# Patient Record
Sex: Female | Born: 1935 | Race: White | Hispanic: No | Marital: Married | Smoking: Never smoker
Health system: Southern US, Community
[De-identification: ages and names within clinical notes are randomized; demographics above are authoritative.]

## PROBLEM LIST (undated history)

## (undated) DIAGNOSIS — E079 Disorder of thyroid, unspecified: Secondary | ICD-10-CM

## (undated) DIAGNOSIS — M1990 Unspecified osteoarthritis, unspecified site: Secondary | ICD-10-CM

## (undated) DIAGNOSIS — K219 Gastro-esophageal reflux disease without esophagitis: Secondary | ICD-10-CM

## (undated) HISTORY — PX: OTHER SURGICAL HISTORY: SHX169

## (undated) HISTORY — PX: KNEE SURGERY: SHX244

---

## 2012-08-05 ENCOUNTER — Emergency Department (HOSPITAL_BASED_OUTPATIENT_CLINIC_OR_DEPARTMENT_OTHER)
Admission: EM | Admit: 2012-08-05 | Discharge: 2012-08-05 | Disposition: A | Discharge status: Home / Self Care | Payer: Self-pay | Attending: Emergency Medicine | Admitting: Emergency Medicine

## 2012-08-05 ENCOUNTER — Encounter (HOSPITAL_BASED_OUTPATIENT_CLINIC_OR_DEPARTMENT_OTHER): Payer: Self-pay | Admitting: *Deleted

## 2012-08-05 ENCOUNTER — Emergency Department (HOSPITAL_BASED_OUTPATIENT_CLINIC_OR_DEPARTMENT_OTHER): Payer: Self-pay

## 2012-08-05 DIAGNOSIS — Y9210 Unspecified residential institution as the place of occurrence of the external cause: Secondary | ICD-10-CM | POA: Insufficient documentation

## 2012-08-05 DIAGNOSIS — Z79899 Other long term (current) drug therapy: Secondary | ICD-10-CM | POA: Insufficient documentation

## 2012-08-05 DIAGNOSIS — W19XXXA Unspecified fall, initial encounter: Secondary | ICD-10-CM

## 2012-08-05 DIAGNOSIS — Y9301 Activity, walking, marching and hiking: Secondary | ICD-10-CM | POA: Insufficient documentation

## 2012-08-05 DIAGNOSIS — S0990XA Unspecified injury of head, initial encounter: Secondary | ICD-10-CM | POA: Insufficient documentation

## 2012-08-05 DIAGNOSIS — K219 Gastro-esophageal reflux disease without esophagitis: Secondary | ICD-10-CM | POA: Insufficient documentation

## 2012-08-05 DIAGNOSIS — W1789XA Other fall from one level to another, initial encounter: Secondary | ICD-10-CM | POA: Insufficient documentation

## 2012-08-05 DIAGNOSIS — R42 Dizziness and giddiness: Secondary | ICD-10-CM | POA: Insufficient documentation

## 2012-08-05 DIAGNOSIS — Z888 Allergy status to other drugs, medicaments and biological substances status: Secondary | ICD-10-CM | POA: Insufficient documentation

## 2012-08-05 HISTORY — DX: Disorder of thyroid, unspecified: E07.9

## 2012-08-05 HISTORY — DX: Gastro-esophageal reflux disease without esophagitis: K21.9

## 2012-08-05 HISTORY — DX: Unspecified osteoarthritis, unspecified site: M19.90

## 2012-08-05 NOTE — ED Provider Notes (Signed)
History     CSN: [id number]  Arrival date & time 08/05/12  1058   First MD Initiated Contact with Patient 08/05/12 1139      Chief Complaint  Patient presents with  . Fall    (Consider location/radiation/quality/duration/timing/severity/associated sxs/prior treatment) HPI Pt presents with c/o fall.  She states she slipped and fell at a nursing home while visiting her mother. Slipped on a wet floor.  States feet flew out from under her and she fell hitting the back of her head.  No bleeding.  No LOC, but did feel dizzy.  No neck or back pain.  No changes in vision, no vomiting or seizure activity.  She has chronic right knee pain which is not made worse by the fall.  There are no other associated systemic symptoms, there are no other alleviating or modifying factors.   Past Medical History  Diagnosis Date  . Thyroid disease   . Arthritis   . Acid reflux     Past Surgical History  Procedure Date  . Knee surgery     left  . Bladder tack     2007    No family history on file.  History  Substance Use Topics  . Smoking status: Never Smoker   . Smokeless tobacco: Not on file  . Alcohol Use: Yes    OB History    Grav Para Term Preterm Abortions TAB SAB Ect Mult Living                  Review of Systems ROS reviewed and all otherwise negative except for mentioned in HPI  Allergies  Codeine  Home Medications   Current Outpatient Rx  Name  Route  Sig  Dispense  Refill  . CELEBREX PO   Oral   Take by mouth.         . SYNTHROID PO   Oral   Take by mouth.         Marland Kitchen RANITIDINE ACID REDUCER PO   Oral   Take by mouth.         Marland Kitchen ZOLOFT PO   Oral   Take by mouth.           BP 141/76  Pulse 69  Temp 98.7 F (37.1 C)  Resp 19  SpO2 98% Vitals reviewed Physical Exam Physical Examination: General appearance - alert, well appearing, and in no distress Mental status - alert, oriented to person, place, and time Head- hematoma over right occiput, no  bleeding, no stepoff or crepitus Eyes - pupils equal and reactive, extraocular eye movements intact Mouth - mucous membranes moist, pharynx normal without lesions Chest - clear to auscultation, no wheezes, rales or rhonchi, symmetric air entry Heart - normal rate, regular rhythm, normal S1, S2, no murmurs, rubs, clicks or gallops Abdomen - soft, nontender, nondistended, no masses or organomegaly Back exam - no midline tenderness to palpation Neurological - alert, oriented, normal speech, cranial nerves grossly intact, strength 5/5 in extremities x 4, sensation intact Musculoskeletal - no joint tenderness, deformity or swelling Extremities - peripheral pulses normal, no pedal edema, no clubbing or cyanosis Skin - normal coloration and turgor, no rashes, except scalp hematoma  ED Course  Procedures (including critical care time)  Labs Reviewed - No data to display Ct Head Wo Contrast  08/05/2012  *RADIOLOGY REPORT*  Clinical Data: Fall  CT HEAD WITHOUT CONTRAST  Technique:  Contiguous axial images were obtained from the base of the skull through the vertex  without contrast.  Comparison: None  Findings:  There is mild low density within the subcortical and periventricular white matter consistent with chronic small vessel ischemic change.  There is prominence of the sulci and ventricles consistent with brain atrophy.  There is no evidence for acute brain infarct, hemorrhage or mass.  The paranasal sinuses and mastoid air cells appear clear.  The skull is intact.  IMPRESSION:  1.  No acute intracranial abnormalities.   Original Report Authenticated By: Signa Kell, M.D.      1. Fall   2. Minor head injury       MDM  Pt presenting with minor head injury after fall.  Hematoma to back of head.  CT scan negative.  Pt is neurologically intact.  Discharged with strict return precautions.  Pt agreeable with plan.        Ethelda Chick, MD 08/06/12 [phone]

## 2012-08-05 NOTE — ED Notes (Signed)
Patient was visiting at the nursing home and slipped on water and hit back of head on floor, no LOC, small swelling on back of head

## 2021-10-27 DIAGNOSIS — E039 Hypothyroidism, unspecified: Secondary | ICD-10-CM | POA: Insufficient documentation

## 2021-10-27 DIAGNOSIS — N1831 Chronic kidney disease, stage 3a: Secondary | ICD-10-CM | POA: Insufficient documentation

## 2021-10-27 DIAGNOSIS — E785 Hyperlipidemia, unspecified: Secondary | ICD-10-CM | POA: Insufficient documentation

## 2021-10-27 DIAGNOSIS — R7303 Prediabetes: Secondary | ICD-10-CM | POA: Insufficient documentation

## 2021-10-29 DIAGNOSIS — K760 Fatty (change of) liver, not elsewhere classified: Secondary | ICD-10-CM | POA: Insufficient documentation

## 2021-10-29 DIAGNOSIS — F32A Depression, unspecified: Secondary | ICD-10-CM | POA: Insufficient documentation

## 2022-03-06 ENCOUNTER — Emergency Department (HOSPITAL_COMMUNITY)
Admission: EM | Admit: 2022-03-06 | Discharge: 2022-03-06 | Disposition: A | Discharge status: Home / Self Care | Payer: Medicare Other | Attending: Emergency Medicine | Admitting: Emergency Medicine

## 2022-03-06 ENCOUNTER — Encounter (HOSPITAL_COMMUNITY): Payer: Self-pay

## 2022-03-06 ENCOUNTER — Emergency Department (HOSPITAL_COMMUNITY): Payer: Medicare Other

## 2022-03-06 DIAGNOSIS — N3090 Cystitis, unspecified without hematuria: Secondary | ICD-10-CM | POA: Diagnosis not present

## 2022-03-06 DIAGNOSIS — R1011 Right upper quadrant pain: Secondary | ICD-10-CM | POA: Diagnosis present

## 2022-03-06 DIAGNOSIS — R109 Unspecified abdominal pain: Secondary | ICD-10-CM

## 2022-03-06 DIAGNOSIS — N390 Urinary tract infection, site not specified: Secondary | ICD-10-CM

## 2022-03-06 LAB — CBC
HCT: 39.3 % (ref 36.0–46.0)
Hemoglobin: 12.9 g/dL (ref 12.0–15.0)
MCH: 30.7 pg (ref 26.0–34.0)
MCHC: 32.8 g/dL (ref 30.0–36.0)
MCV: 93.6 fL (ref 80.0–100.0)
Platelets: 239 10*3/uL (ref 150–400)
RBC: 4.2 MIL/uL (ref 3.87–5.11)
RDW: 13.9 % (ref 11.5–15.5)
WBC: 5.3 10*3/uL (ref 4.0–10.5)
nRBC: 0 % (ref 0.0–0.2)

## 2022-03-06 LAB — URINALYSIS, ROUTINE W REFLEX MICROSCOPIC
Bacteria, UA: NONE SEEN
Bilirubin Urine: NEGATIVE
Glucose, UA: NEGATIVE mg/dL
Hgb urine dipstick: NEGATIVE
Ketones, ur: NEGATIVE mg/dL
Nitrite: NEGATIVE
Protein, ur: NEGATIVE mg/dL
Specific Gravity, Urine: >1.046 — ABNORMAL HIGH (ref 1.005–1.030)
pH: 5 (ref 5.0–8.0)

## 2022-03-06 LAB — COMPREHENSIVE METABOLIC PANEL
ALT: 19 U/L (ref 0–44)
AST: 20 U/L (ref 15–41)
Albumin: 4.4 g/dL (ref 3.5–5.0)
Alkaline Phosphatase: 55 U/L (ref 38–126)
Anion gap: 9 (ref 5–15)
BUN: 26 mg/dL — ABNORMAL HIGH (ref 8–23)
CO2: 23 mmol/L (ref 22–32)
Calcium: 9.6 mg/dL (ref 8.9–10.3)
Chloride: 109 mmol/L (ref 98–111)
Creatinine, Ser: 1.11 mg/dL — ABNORMAL HIGH (ref 0.44–1.00)
GFR, Estimated: 49 mL/min — ABNORMAL LOW (ref >60)
Glucose, Bld: 92 mg/dL (ref 70–99)
Potassium: 4 mmol/L (ref 3.5–5.1)
Sodium: 141 mmol/L (ref 135–145)
Total Bilirubin: 0.5 mg/dL (ref 0.3–1.2)
Total Protein: 7.3 g/dL (ref 6.5–8.1)

## 2022-03-06 LAB — LIPASE, BLOOD: Lipase: 32 U/L (ref 11–51)

## 2022-03-06 MED ORDER — IOHEXOL 300 MG/ML  SOLN
100.0000 mL | Freq: Once | INTRAMUSCULAR | Status: AC | PRN
  Administered 2022-03-06: 100 mL via INTRAVENOUS

## 2022-03-06 MED ORDER — SODIUM CHLORIDE 0.9 % IV SOLN: INTRAVENOUS | Status: DC

## 2022-03-06 MED ORDER — CEFDINIR 300 MG PO CAPS: 300.0000 mg | ORAL_CAPSULE | Freq: Two times a day (BID) | ORAL | 0 refills | Status: DC

## 2022-03-06 MED ORDER — HYDROMORPHONE HCL 1 MG/ML IJ SOLN
0.5000 mg | Freq: Once | INTRAMUSCULAR | Status: AC
  Administered 2022-03-06: 0.5 mg via INTRAVENOUS
  Filled 2022-03-06: qty 1

## 2022-03-06 NOTE — ED Provider Notes (Signed)
[facility]-EMERGENCY DEPT Provider Note   CSN: [id number] Arrival date & time: 03/06/22  1747     History  Chief Complaint  Patient presents with   Abdominal Pain    Tasha Scott is a 86 y.o. female.  This is a 86 year old female presents with abdominal discomfort x1 day.  Pain is in the right upper quadrant.  It is persistent in nature.  It has not been associated with emesis or fever or diarrhea.  It has not been pleuritic.  No prior history of same.  No treatment use prior to arrival      Home Medications Prior to Admission medications   Medication Sig Start Date End Date Taking? Authorizing Provider  Celecoxib (CELEBREX PO) Take by mouth.    [provider]  Levothyroxine Sodium (SYNTHROID PO) Take by mouth.    [provider]  Ranitidine HCl (RANITIDINE ACID REDUCER PO) Take by mouth.    [provider]  Sertraline HCl (ZOLOFT PO) Take by mouth.    [provider]      Allergies    Codeine    Review of Systems   Review of Systems  All other systems reviewed and are negative.  Physical Exam Updated Vital Signs BP (!) 151/70 (BP Location: Left Arm)   Pulse 64   Temp 98.6 F (37 C) (Oral)   Resp 16   SpO2 100%  Physical Exam Vitals and nursing note reviewed.  Constitutional:      General: She is not in acute distress.    Appearance: Normal appearance. She is well-developed. She is not toxic-appearing.  HENT:     Head: Normocephalic and atraumatic.  Eyes:     General: Lids are normal.     Conjunctiva/sclera: Conjunctivae normal.     Pupils: Pupils are equal, round, and reactive to light.  Neck:     Thyroid: No thyroid mass.     Trachea: No tracheal deviation.  Cardiovascular:     Rate and Rhythm: Normal rate and regular rhythm.     Heart sounds: Normal heart sounds. No murmur heard.   No gallop.  Pulmonary:     Effort: Pulmonary effort is normal. No respiratory distress.     Breath sounds:  Normal breath sounds. No stridor. No decreased breath sounds, wheezing, rhonchi or rales.  Abdominal:     General: There is no distension.     Palpations: Abdomen is soft.     Tenderness: There is abdominal tenderness in the right upper quadrant. There is no rebound.  Musculoskeletal:        General: No tenderness. Normal range of motion.     Cervical back: Normal range of motion and neck supple.  Skin:    General: Skin is warm and dry.     Findings: No abrasion or rash.  Neurological:     Mental Status: She is alert and oriented to person, place, and time. Mental status is at baseline.     GCS: GCS eye subscore is 4. GCS verbal subscore is 5. GCS motor subscore is 6.     Cranial Nerves: No cranial nerve deficit.     Sensory: No sensory deficit.     Motor: Motor function is intact.  Psychiatric:        Attention and Perception: Attention normal.        Speech: Speech normal.        Behavior: Behavior normal.    ED Results / Procedures / Treatments  Labs (all labs ordered are listed, but only abnormal results are displayed) Labs Reviewed  COMPREHENSIVE METABOLIC PANEL - Abnormal; Notable for the following components:      Result Value   BUN 26 (*)    Creatinine, Ser 1.11 (*)    GFR, Estimated 49 (*)    All other components within normal limits  LIPASE, BLOOD  CBC  URINALYSIS, ROUTINE W REFLEX MICROSCOPIC    EKG None  Radiology US Abdomen Limited RUQ (LIVER/GB)  Result Date: 03/06/2022 CLINICAL DATA:  Right upper quadrant abdominal pain. EXAM: ULTRASOUND ABDOMEN LIMITED RIGHT UPPER QUADRANT COMPARISON:  None Available. FINDINGS: Gallbladder: No gallstones or wall thickening visualized. No sonographic Murphy sign noted by sonographer. Common bile duct: Diameter: Normal, 3 mm. Liver: Mildly increased, slightly heterogeneous hepatic echotexture. Portal vein is patent on color Doppler imaging with normal direction of blood flow towards the liver. Other: None. IMPRESSION: No  acute process or explanation for right upper quadrant pain. Mildly heterogeneously increased hepatic echogenicity, most likely related to steatosis. Electronically Signed   By: Jeronimo Greaves M.D.   On: 03/06/2022 19:31    Procedures Procedures    Medications Ordered in ED Medications - No data to display  ED Course/ Medical Decision Making/ A&P                           Medical Decision Making Amount and/or Complexity of Data Reviewed Labs: ordered. Radiology: ordered.  Risk Prescription drug management.   Patient presented with abdominal discomfort.  Concern for possible cholecystitis.  Had ultrasound which per my interpretation showed no gallstones.  Patient subsequently had abdominal CT which showed cystitis.  Will be treated with antibiotics for this.  Patient does not quite mention at this time.  Her and her friend are agreeable to this        Final Clinical Impression(s) / ED Diagnoses Final diagnoses:  None    Rx / DC Orders ED Discharge Orders     None         Lorre Nick, MD 03/06/22 2219

## 2022-03-06 NOTE — ED Provider Triage Note (Signed)
Emergency Medicine Provider Triage Evaluation Note  Tasha Scott , a 86 y.o. female  was evaluated in triage.  Pt complains of RUQ abdominal pain. She states that same began earlier today and has been persistent since. She states that she had a few episodes of nonbloody diarrhea yesterday but none today.  Denies any nausea or vomiting.  Denies any fevers or chills.  Denies any history of abdominal surgeries.  Denies any chest pain or shortness of breath.  Review of Systems  Positive:  Negative: See above  Physical Exam  BP (!) 151/74 (BP Location: Right Arm)   Pulse 84   Temp 98.6 F (37 C) (Oral)   Resp 18   SpO2 97%  Gen:   Awake, no distress   Resp:  Normal effort  MSK:   Moves extremities without difficulty Other:  RUQ abdominal tenderness noted  Medical Decision Making  Medically screening exam initiated at 6:39 PM.  Appropriate orders placed.  Tasha Scott was informed that the remainder of the evaluation will be completed by another provider, this initial triage assessment does not replace that evaluation, and the importance of remaining in the ED until their evaluation is complete.  Work-up initiated   Vear Clock 03/06/22 1841

## 2022-03-06 NOTE — ED Provider Notes (Signed)
  [facility]-EMERGENCY DEPT Provider Note   CSN: [id number] Arrival date & time: 03/06/22  1747     History  Chief Complaint  Patient presents with   Abdominal Pain    Tasha Scott is a 86 y.o. female.  HPI  Discard this note  Home Medications Prior to Admission medications   Medication Sig Start Date End Date Taking? Authorizing Provider  Celecoxib (CELEBREX PO) Take by mouth.    [provider]  Levothyroxine Sodium (SYNTHROID PO) Take by mouth.    [provider]  Ranitidine HCl (RANITIDINE ACID REDUCER PO) Take by mouth.    [provider]  Sertraline HCl (ZOLOFT PO) Take by mouth.    [provider]      Allergies    Codeine    Review of Systems   Review of Systems  Physical Exam Updated Vital Signs BP (!) 151/70 (BP Location: Left Arm)   Pulse 64   Temp 98.6 F (37 C) (Oral)   Resp 16   SpO2 100%  Physical Exam  ED Results / Procedures / Treatments   Labs (all labs ordered are listed, but only abnormal results are displayed) Labs Reviewed  COMPREHENSIVE METABOLIC PANEL - Abnormal; Notable for the following components:      Result Value   BUN 26 (*)    Creatinine, Ser 1.11 (*)    GFR, Estimated 49 (*)    All other components within normal limits  LIPASE, BLOOD  CBC  URINALYSIS, ROUTINE W REFLEX MICROSCOPIC    EKG None  Radiology US Abdomen Limited RUQ (LIVER/GB)  Result Date: 03/06/2022 CLINICAL DATA:  Right upper quadrant abdominal pain. EXAM: ULTRASOUND ABDOMEN LIMITED RIGHT UPPER QUADRANT COMPARISON:  None Available. FINDINGS: Gallbladder: No gallstones or wall thickening visualized. No sonographic Murphy sign noted by sonographer. Common bile duct: Diameter: Normal, 3 mm. Liver: Mildly increased, slightly heterogeneous hepatic echotexture. Portal vein is patent on color Doppler imaging with normal direction of blood flow towards the liver. Other: None. IMPRESSION: No acute process or  explanation for right upper quadrant pain. Mildly heterogeneously increased hepatic echogenicity, most likely related to steatosis. Electronically Signed   By: Jeronimo Greaves M.D.   On: 03/06/2022 19:31    Procedures Procedures    Medications Ordered in ED Medications - No data to display  ED Course/ Medical Decision Making/ A&P                           Medical Decision Making Amount and/or Complexity of Data Reviewed Labs: ordered. Radiology: ordered.  Risk Prescription drug management.   Discard this note       Final Clinical Impression(s) / ED Diagnoses Final diagnoses:  None    Rx / DC Orders ED Discharge Orders     None         Lorre Nick, MD 03/08/22 1510

## 2022-03-06 NOTE — ED Triage Notes (Signed)
Pt c/o RUQ abdominal pain that started earlier today. Pt denies N/V. States she did have episodes of diarrhea yesterday. No CP/SOB per pt.

## 2022-11-01 DIAGNOSIS — H9313 Tinnitus, bilateral: Secondary | ICD-10-CM | POA: Insufficient documentation

## 2022-12-07 NOTE — Progress Notes (Signed)
Surgery orders requested via Epic inbox. °

## 2022-12-08 ENCOUNTER — Ambulatory Visit: Payer: Self-pay | Admitting: Student

## 2022-12-10 NOTE — Progress Notes (Signed)
Anesthesia Review:  PCP: Gilmore Laroche- clearance 10/06/22 on chart LOV 08/13/22 on chart  Cardiologist : Chest x-ray : EKG : Echo : Stress test: Cardiac Cath :  Activity level:  Sleep Study/ CPAP : Fasting Blood Sugar :      / Checks Blood Sugar -- times a day:   Blood Thinner/ Instructions /Last Dose: ASA / Instructions/ Last Dose :

## 2022-12-10 NOTE — Patient Instructions (Signed)
SURGICAL WAITING ROOM VISITATION  Patients having surgery or a procedure may have no more than 2 support people in the waiting area - these visitors may rotate.    Children under the age of 66 must have an adult with them who is not the patient.  Due to an increase in RSV and influenza rates and associated hospitalizations, children ages 3 and under may not visit patients in [place].  If the patient needs to stay at the hospital during part of their recovery, the visitor guidelines for inpatient rooms apply. Pre-op nurse will coordinate an appropriate time for 1 support person to accompany patient in pre-op.  This support person may not rotate.    Please refer to the [facility] website for the visitor guidelines for Inpatients (after your surgery is over and you are in a regular room).       Your procedure is scheduled on:  12/23/22    Report to [facility] Main Entrance    Report to admitting at  [place] AM   Call this number if you have problems the morning of surgery [phone]   Do not eat food :After Midnight.   After Midnight you may have the following liquids until ___ 0430___ AM DAY OF SURGERY  Water Non-Citrus Juices (without pulp, NO RED-Apple, White grape, White cranberry) Black Coffee (NO MILK/CREAM OR CREAMERS, sugar ok)  Clear Tea (NO MILK/CREAM OR CREAMERS, sugar ok) regular and decaf                             Plain Jell-O (NO RED)                                           Fruit ices (not with fruit pulp, NO RED)                                     Popsicles (NO RED)                                                               Sports drinks like Gatorade (NO RED)                    The day of surgery:  Drink ONE (1) Pre-Surgery Clear Ensure or G2 at  0430 AM   ( have completed by ) the morning of surgery. Drink in one sitting. Do not sip.  This drink was given to you during your hospital  pre-op appointment visit. Nothing else to  drink after completing the  Pre-Surgery Clear Ensure or G2.          If you have questions, please contact your surgeon's office.       Oral Hygiene is also important to reduce your risk of infection.                                    Remember - BRUSH YOUR TEETH THE MORNING OF SURGERY  WITH YOUR REGULAR TOOTHPASTE  DENTURES WILL BE REMOVED PRIOR TO SURGERY PLEASE DO NOT APPLY "Poly grip" OR ADHESIVES!!!   Do NOT smoke after Midnight   Take these medicines the morning of surgery with A SIP OF WATER:   DO NOT TAKE ANY ORAL DIABETIC MEDICATIONS DAY OF YOUR SURGERY  Bring CPAP mask and tubing day of surgery.                              You may not have any metal on your body including hair pins, jewelry, and body piercing             Do not wear make-up, lotions, powders, perfumes/cologne, or deodorant  Do not wear nail polish including gel and S&S, artificial/acrylic nails, or any other type of covering on natural nails including finger and toenails. If you have artificial nails, gel coating, etc. that needs to be removed by a nail salon please have this removed prior to surgery or surgery may need to be canceled/ delayed if the surgeon/ anesthesia feels like they are unable to be safely monitored.   Do not shave  48 hours prior to surgery.               Men may shave face and neck.   Do not bring valuables to the hospital. [place].   Contacts, glasses, dentures or bridgework may not be worn into surgery.   Bring small overnight bag day of surgery.   DO NOT [place]. PHARMACY WILL DISPENSE MEDICATIONS LISTED ON YOUR MEDICATION LIST TO YOU DURING YOUR ADMISSION [place]!    Patients discharged on the day of surgery will not be allowed to drive home.  Someone NEEDS to stay with you for the first 24 hours after anesthesia.   Special Instructions: Bring a copy of your healthcare power of  attorney and living will documents the day of surgery if you haven't scanned them before.              Please read over the following fact sheets you were given: IF [place] [phone]   If you received a COVID test during your pre-op visit  it is requested that you wear a mask when out in public, stay away from anyone that may not be feeling well and notify your surgeon if you develop symptoms. If you test positive for Covid or have been in contact with anyone that has tested positive in the last 10 days please notify you surgeon.    [place] - Preparing for Surgery Before surgery, you can play an important role.  Because skin is not sterile, your skin needs to be as free of germs as possible.  You can reduce the number of germs on your skin by washing with CHG (chlorahexidine gluconate) soap before surgery.  CHG is an antiseptic cleaner which kills germs and bonds with the skin to continue killing germs even after washing. Please DO NOT use if you have an allergy to CHG or antibacterial soaps.  If your skin becomes reddened/irritated stop using the CHG and inform your nurse when you arrive at Short Stay. Do not shave (including legs and underarms) for at least 48 hours prior to the first CHG shower.  You may shave your face/neck. Please follow these instructions carefully:  1.  Shower with CHG Soap the night before surgery and the  morning of Surgery.  2.  If you choose to wash your hair, wash your hair first as usual with your  normal  shampoo.  3.  After you shampoo, rinse your hair and body thoroughly to remove the  shampoo.                           4.  Use CHG as you would any other liquid soap.  You can apply chg directly  to the skin and wash                       Gently with a scrungie or clean washcloth.  5.  Apply the CHG Soap to your body ONLY FROM THE NECK DOWN.   Do not use on face/ open                           Wound or open  sores. Avoid contact with eyes, ears mouth and genitals (private parts).                       Wash face,  Genitals (private parts) with your normal soap.             6.  Wash thoroughly, paying special attention to the area where your surgery  will be performed.  7.  Thoroughly rinse your body with warm water from the neck down.  8.  DO NOT shower/wash with your normal soap after using and rinsing off  the CHG Soap.                9.  Pat yourself dry with a clean towel.            10.  Wear clean pajamas.            11.  Place clean sheets on your bed the night of your first shower and do not  sleep with pets. Day of Surgery : Do not apply any lotions/deodorants the morning of surgery.  Please wear clean clothes to the hospital/surgery center.  FAILURE TO FOLLOW THESE INSTRUCTIONS MAY RESULT IN THE CANCELLATION OF YOUR SURGERY PATIENT SIGNATURE_________________________________  NURSE SIGNATURE__________________________________  ________________________________________________________________________

## 2022-12-14 ENCOUNTER — Encounter (HOSPITAL_COMMUNITY)
Admission: RE | Admit: 2022-12-14 | Discharge: 2022-12-14 | Disposition: A | Discharge status: Home / Self Care | Payer: Medicare Other | Source: Ambulatory Visit

## 2022-12-23 ENCOUNTER — Ambulatory Visit: Admit: 2022-12-23 | Payer: Medicare Other | Admitting: Orthopedic Surgery

## 2022-12-23 SURGERY — ARTHROPLASTY, KNEE, TOTAL, USING IMAGELESS COMPUTER-ASSISTED NAVIGATION
Anesthesia: Spinal | Site: Knee | Laterality: Left

## 2023-05-27 ENCOUNTER — Ambulatory Visit (INDEPENDENT_AMBULATORY_CARE_PROVIDER_SITE_OTHER): Payer: Medicare Other | Admitting: Family Medicine

## 2023-05-27 ENCOUNTER — Encounter: Payer: Self-pay | Admitting: Family Medicine

## 2023-05-27 VITALS — BP 118/80 | HR 69 | Temp 97.8°F | Ht 64.75 in | Wt 165.0 lb

## 2023-05-27 DIAGNOSIS — G4700 Insomnia, unspecified: Secondary | ICD-10-CM

## 2023-05-27 DIAGNOSIS — G3184 Mild cognitive impairment, so stated: Secondary | ICD-10-CM | POA: Insufficient documentation

## 2023-05-27 DIAGNOSIS — E039 Hypothyroidism, unspecified: Secondary | ICD-10-CM

## 2023-05-27 MED ORDER — HYDROXYZINE PAMOATE 25 MG PO CAPS: 25.0000 mg | ORAL_CAPSULE | Freq: Three times a day (TID) | ORAL | 0 refills | Status: DC | PRN

## 2023-05-27 NOTE — Progress Notes (Unsigned)
New Patient Office Visit  Subjective    Patient ID: Tasha Scott, female    DOB: 01/11/1936  Age: 87 y.o. MRN: [id number]  CC: No chief complaint on file.   HPI Tasha Scott presents to establish care Patient was previously being seen by [facility]. States that she is having a new symptom of difficulty sleeping at night. States that her grandson is getting married next month and she has to drive to [place]. Patient states that she has been anxious recently for this wedding and she reports increased nervousness. States that she cannot fall asleep, has been using melatonin 10 mg but it is not effective.   Patient states that she is trying to lose some weight, states she is cutting out carbs/reducing sugar. States that she does try to stay active.  States that she feels like she has some short term memory loss. Sometimes she will walk into a store and forget why she went there. Also she reports that she will try to remember a task but then 10 minutes later she will forget it. She reports that she did have her medicare visit a couple of months ago at [facility], states she did well on her mental status screening. States she did have blood work done just a couple of months ago.  Has a history of knee OA, she already has the right one replaced. Is supposed to get the left knee done, right now she is taking the diclofenac PRN.   I have reviewed all aspects of the patient's medical history including social, family, and surgical history.   Current Outpatient Medications  Medication Instructions   atorvastatin (LIPITOR) 20 mg, Oral, Daily   b complex vitamins capsule 1 capsule, Oral, Daily   Cholecalciferol (VITAMIN D3) 125 MCG (5000 UT) CAPS Oral   Diclofenac Sodium CR 100 mg, Oral, Daily PRN   hydrOXYzine (VISTARIL) 25 mg, Oral, Every 8 hours PRN   Multiple Vitamins-Minerals (WOMENS MULTIVITAMIN) TABS 1 tablet, Oral, Daily   Nutritional Supplements (JUICE PLUS FIBRE PO) 2 capsules,  Oral, 2 times daily   Probiotic Product (PROBIOTIC-10 PO) 1 capsule, Oral, Daily   sertraline (ZOLOFT) 150 mg, Oral, Daily   Synthroid 75 mcg, Oral, Every M-W-F   Synthroid 50 mcg, Oral, Every T-Th-S-Su    Past Medical History:  Diagnosis Date   Acid reflux    Arthritis    Thyroid disease     Past Surgical History:  Procedure Laterality Date   bladder tack     2007   KNEE SURGERY Right    left    History reviewed. No pertinent family history.  Social History   Socioeconomic History   Marital status: Married    Spouse name: Not on file   Number of children: Not on file   Years of education: Not on file   Highest education level: Not on file  Occupational History   Not on file  Tobacco Use   Smoking status: Never   Smokeless tobacco: Not on file  Substance and Sexual Activity   Alcohol use: Yes   Drug use: No   Sexual activity: Not on file  Other Topics Concern   Not on file  Social History Narrative   Not on file   Social Determinants of Health   Financial Resource Strain: Not on file  Food Insecurity: Not on file  Transportation Needs: Not on file  Physical Activity: Not on file  Stress: Not on file  Social Connections: Not  on file  Intimate Partner Violence: Not on file    Review of Systems  All other systems reviewed and are negative.       Objective    BP 118/80 (BP Location: Left Arm, Patient Position: Sitting, Cuff Size: Large)   Pulse 69   Temp 97.8 F (36.6 C) (Oral)   Ht 5' 4.75" (1.645 m)   Wt 165 lb (74.8 kg)   SpO2 95%   BMI 27.67 kg/m   Physical Exam Vitals reviewed.  Constitutional:      Appearance: Normal appearance. She is normal weight.  Neck:     Thyroid: No thyromegaly.  Neurological:     General: No focal deficit present.     Mental Status: She is alert and oriented to person, place, and time. Mental status is at baseline.  Psychiatric:        Mood and Affect: Mood normal.        Behavior: Behavior normal.         Thought Content: Thought content normal.     {Labs (Optional):23779}    Assessment & Plan:  Acute insomnia -     hydrOXYzine Pamoate; Take 1 capsule (25 mg total) by mouth every 8 (eight) hours as needed.  Dispense: 30 capsule; Refill: 0  Mild cognitive impairment    Return in about 6 months (around 11/27/2023).   Karie Georges, MD

## 2023-05-27 NOTE — Patient Instructions (Signed)
Omega 3- fatty acid supplements-- 2-4 grams per day  Vitamin B12 supplements

## 2023-05-31 DIAGNOSIS — G4700 Insomnia, unspecified: Secondary | ICD-10-CM | POA: Insufficient documentation

## 2023-05-31 NOTE — Assessment & Plan Note (Signed)
Will treat with PRN hydralazine as needed for sleep. Reassured patient that this is likely due to upcoming family event.

## 2023-05-31 NOTE — Assessment & Plan Note (Signed)
Per the patient, she is Alert and oriented x 3, will continue to monitor her symptoms for signs of progression.

## 2023-05-31 NOTE — Assessment & Plan Note (Signed)
On levothyroxine 50 mcg/ 75 mcg alternating days of the week. Will obtain records from [facility] to review her latest bloodwork, will continue this dosing for now.

## 2023-06-03 ENCOUNTER — Telehealth: Payer: Self-pay | Admitting: Family Medicine

## 2023-06-03 NOTE — Telephone Encounter (Signed)
Pt states she was prescribed Hydroxyzine 25 mg to help with sleeping, but she is confused as to how often she is supposed to be taking the tablets. She would like a call back from the nurse for clarification.

## 2023-06-07 NOTE — Telephone Encounter (Signed)
Patient informed of the message below and stated she does not have anxiety attacks.

## 2023-06-07 NOTE — Telephone Encounter (Signed)
I called the patient for more information.  Patient was advised the Rx states to take the medication every 8 hours as needed and I asked if she had more questions.  Patient stated she expected to have something to only take at bedtime to help her sleep and this made her think the pharmacist made a mistake with the Rx?  Message sent to PCP.

## 2023-06-07 NOTE — Telephone Encounter (Signed)
She can take it at night OR she can take it during the day if she has an anxiety attack.

## 2023-10-18 ENCOUNTER — Encounter: Payer: TRICARE For Life (TFL) | Admitting: Family Medicine

## 2023-10-18 NOTE — Progress Notes (Signed)
 Error

## 2023-11-28 ENCOUNTER — Encounter: Payer: Self-pay | Admitting: Family Medicine

## 2023-11-28 ENCOUNTER — Telehealth: Payer: Self-pay | Admitting: Family Medicine

## 2023-11-28 ENCOUNTER — Ambulatory Visit (INDEPENDENT_AMBULATORY_CARE_PROVIDER_SITE_OTHER): Payer: HMO | Admitting: Family Medicine

## 2023-11-28 VITALS — BP 132/48 | HR 67 | Temp 98.8°F | Ht 64.75 in | Wt 173.8 lb

## 2023-11-28 DIAGNOSIS — R7303 Prediabetes: Secondary | ICD-10-CM | POA: Diagnosis not present

## 2023-11-28 DIAGNOSIS — E039 Hypothyroidism, unspecified: Secondary | ICD-10-CM

## 2023-11-28 DIAGNOSIS — E782 Mixed hyperlipidemia: Secondary | ICD-10-CM | POA: Diagnosis not present

## 2023-11-28 DIAGNOSIS — G3184 Mild cognitive impairment, so stated: Secondary | ICD-10-CM | POA: Diagnosis not present

## 2023-11-28 DIAGNOSIS — F32A Depression, unspecified: Secondary | ICD-10-CM | POA: Diagnosis not present

## 2023-11-28 MED ORDER — SYNTHROID 50 MCG PO TABS: 50.0000 ug | ORAL_TABLET | ORAL | 1 refills | Status: AC

## 2023-11-28 MED ORDER — ATORVASTATIN CALCIUM 20 MG PO TABS: 20.0000 mg | ORAL_TABLET | Freq: Every day | ORAL | 1 refills | Status: DC

## 2023-11-28 MED ORDER — SYNTHROID 75 MCG PO TABS: 75.0000 ug | ORAL_TABLET | ORAL | 1 refills | Status: AC

## 2023-11-28 MED ORDER — MEMANTINE HCL 5 MG PO TABS: ORAL_TABLET | ORAL | 1 refills | Status: AC

## 2023-11-28 MED ORDER — SERTRALINE HCL 100 MG PO TABS: 150.0000 mg | ORAL_TABLET | Freq: Every day | ORAL | 1 refills | Status: DC

## 2023-11-28 NOTE — Telephone Encounter (Signed)
 Patient stated at discharge that her daughter would like her to see a Kidney Specialist.  Please advise at [phone]

## 2023-11-28 NOTE — Patient Instructions (Signed)
 [email or web address]  Check out the offerings at the senior center!

## 2023-11-28 NOTE — Progress Notes (Unsigned)
 Established Patient Office Visit  Subjective   Patient ID: Tasha Scott, female    DOB: [birth year]  Age: 88 y.o. MRN: [id number]  Chief Complaint  Patient presents with  . Medical Management of Chronic Issues    Pt reports that the cold has "kept her down" recently. Pt reports that she is having a lot of trouble remembering things, couldn't remember if she was 28 or 88 years old. States that her sister lives in town, states that she has trouble remembering things too, lives at [facility]. Pt reports that she just went to the eye doctor this morning and was told she had "macular disturbance", states her sister also has MD. States that she has not spoken to her daughter about her condition. I asked her if it would be ok for me to speak with her daughter about her memory issues. Patient had many questions about her medications, why she was taking certain medications, states that she hasn't been as consistent with her medications as she should.    Current Outpatient Medications  Medication Instructions  . atorvastatin (LIPITOR) 20 mg, Oral, Daily  . b complex vitamins capsule 1 capsule, Daily  . Cholecalciferol (VITAMIN D3) 125 MCG (5000 UT) CAPS Take by mouth.  . Diclofenac Sodium CR 100 mg, Daily PRN  . hydrOXYzine (VISTARIL) 25 mg, Oral, Every 8 hours PRN  . memantine (NAMENDA) 5 MG tablet Take 1 tablet (5 mg total) by mouth daily for 30 days, THEN 1 tablet (5 mg total) 2 (two) times daily.  . Multiple Vitamins-Minerals (WOMENS MULTIVITAMIN) TABS 1 tablet, Daily  . Nutritional Supplements (JUICE PLUS FIBRE PO) 2 capsules, 2 times daily  . Probiotic Product (PROBIOTIC-10 PO) 1 capsule, Daily  . sertraline (ZOLOFT) 150 mg, Oral, Daily  . Synthroid 75 mcg, Oral, Every M-W-F  . Melene Muller ON 11/29/2023] Synthroid 50 mcg, Oral, Every T-Th-S-Su    Patient Active Problem List   Diagnosis Date Noted  . Acute insomnia 05/31/2023  . Mild cognitive impairment 05/27/2023  . Tinnitus of both  ears 11/01/2022  . Depressive disorder 10/29/2021  . Steatosis of liver 10/29/2021  . Acquired hypothyroidism 10/27/2021  . Chronic kidney disease, stage 3a (HCC) 10/27/2021  . Hyperlipidemia 10/27/2021  . Prediabetes 10/27/2021      Review of Systems  All other systems reviewed and are negative.     Objective:     BP (!) 132/48   Pulse 67   Temp 98.8 F (37.1 C) (Oral)   Ht 5' 4.75" (1.645 m)   Wt 173 lb 12.8 oz (78.8 kg)   SpO2 97%   BMI 29.15 kg/m  {Vitals History (Optional):23777}  Physical Exam Vitals reviewed.  Constitutional:      Appearance: Normal appearance. She is well-groomed and normal weight.  Eyes:     Conjunctiva/sclera: Conjunctivae normal.  Neck:     Thyroid: No thyromegaly.  Cardiovascular:     Rate and Rhythm: Normal rate and regular rhythm.     Pulses: Normal pulses.     Heart sounds: S1 normal and S2 normal.  Pulmonary:     Effort: Pulmonary effort is normal.     Breath sounds: Normal breath sounds and air entry.  Abdominal:     General: Bowel sounds are normal.  Musculoskeletal:     Right lower leg: No edema.     Left lower leg: No edema.  Neurological:     Mental Status: She is alert and oriented to person, place, and time. Mental  status is at baseline.     Gait: Gait is intact.  Psychiatric:        Mood and Affect: Mood and affect normal.        Speech: Speech normal.        Behavior: Behavior normal.        Judgment: Judgment normal.     No results found for any visits on 11/28/23.  {Labs (Optional):23779}  The ASCVD Risk score (Arnett DK, et al., 2019) failed to calculate for the following reasons:   The 2019 ASCVD risk score is only valid for ages 38 to 10    Assessment & Plan:  Prediabetes -     Comprehensive metabolic panel; Future -     Hemoglobin A1c; Future  Mixed hyperlipidemia -     Lipid panel; Future -     Atorvastatin Calcium; Take 1 tablet (20 mg total) by mouth daily.  Dispense: 90 tablet; Refill:  1  Acquired hypothyroidism -     TSH; Future -     Synthroid; Take 1 tablet (75 mcg total) by mouth every Monday, Wednesday, and Friday.  Dispense: 45 tablet; Refill: 1 -     Synthroid; Take 1 tablet (50 mcg total) by mouth every Tuesday, Thursday, Saturday, and Sunday.  Dispense: 60 tablet; Refill: 1  Mild cognitive impairment -     Memantine HCl; Take 1 tablet (5 mg total) by mouth daily for 30 days, THEN 1 tablet (5 mg total) 2 (two) times daily.  Dispense: 180 tablet; Refill: 1  Depressive disorder -     Sertraline HCl; Take 1.5 tablets (150 mg total) by mouth daily.  Dispense: 135 tablet; Refill: 1     Return in about 6 months (around 05/27/2024) for follow up on memory.    Karie Georges, MD

## 2023-11-29 LAB — TSH: TSH: 1.02 u[IU]/mL (ref 0.35–5.50)

## 2023-11-29 LAB — LIPID PANEL
Cholesterol: 156 mg/dL (ref 0–200)
HDL: 69.1 mg/dL (ref >39.00)
LDL Cholesterol: 57 mg/dL (ref 0–99)
NonHDL: 87.08
Total CHOL/HDL Ratio: 2
Triglycerides: 148 mg/dL (ref 0.0–149.0)
VLDL: 29.6 mg/dL (ref 0.0–40.0)

## 2023-11-29 LAB — COMPREHENSIVE METABOLIC PANEL
ALT: 21 U/L (ref 0–35)
AST: 24 U/L (ref 0–37)
Albumin: 4.1 g/dL (ref 3.5–5.2)
Alkaline Phosphatase: 55 U/L (ref 39–117)
BUN: 20 mg/dL (ref 6–23)
CO2: 26 meq/L (ref 19–32)
Calcium: 9.3 mg/dL (ref 8.4–10.5)
Chloride: 106 meq/L (ref 96–112)
Creatinine, Ser: 1.1 mg/dL (ref 0.40–1.20)
GFR: 45.62 mL/min — ABNORMAL LOW (ref >60.00)
Glucose, Bld: 116 mg/dL — ABNORMAL HIGH (ref 70–99)
Potassium: 3.9 meq/L (ref 3.5–5.1)
Sodium: 142 meq/L (ref 135–145)
Total Bilirubin: 0.4 mg/dL (ref 0.2–1.2)
Total Protein: 7 g/dL (ref 6.0–8.3)

## 2023-11-29 LAB — HEMOGLOBIN A1C: Hgb A1c MFr Bld: 6.3 % (ref 4.6–6.5)

## 2023-11-29 NOTE — Telephone Encounter (Signed)
 She was supposed to have labs done yesterday after her visit, I will look at them and decide if she needs a kidney specialist.

## 2023-11-29 NOTE — Telephone Encounter (Signed)
 Spoke with the patient, informed her of the message below and she stated she will let her daughter know also.

## 2023-11-30 NOTE — Assessment & Plan Note (Signed)
 Checking new A1C today for surveillance, not currently on medication for this.

## 2023-11-30 NOTE — Assessment & Plan Note (Signed)
 This appears to be somewhat worse since the last visit. Pt lives alone and her daughter lives a few hours away. I asked if I could call the daughter and discuss her care and she is agreeable. I advised that we start a small dose of memantine to help slow the progression of the dementia.

## 2023-11-30 NOTE — Assessment & Plan Note (Signed)
 Checking new lipid panel, will continue statin medication as prescribed

## 2023-11-30 NOTE — Assessment & Plan Note (Signed)
 On levothyroxine 50 mcg/ 75 mcg alternating days of the week. Will obtain new TSH today for surveillance.

## 2023-11-30 NOTE — Assessment & Plan Note (Signed)
 Affect is flat in visit today, it is difficult to determine if her memory issues are from depression or dementia. Will continue sertraline 150 mg daily for now.

## 2023-12-02 DIAGNOSIS — E559 Vitamin D deficiency, unspecified: Secondary | ICD-10-CM | POA: Diagnosis not present

## 2023-12-02 DIAGNOSIS — E663 Overweight: Secondary | ICD-10-CM | POA: Diagnosis not present

## 2023-12-02 DIAGNOSIS — E785 Hyperlipidemia, unspecified: Secondary | ICD-10-CM | POA: Diagnosis not present

## 2023-12-02 DIAGNOSIS — J309 Allergic rhinitis, unspecified: Secondary | ICD-10-CM | POA: Diagnosis not present

## 2023-12-02 DIAGNOSIS — F419 Anxiety disorder, unspecified: Secondary | ICD-10-CM | POA: Diagnosis not present

## 2023-12-02 DIAGNOSIS — G8929 Other chronic pain: Secondary | ICD-10-CM | POA: Diagnosis not present

## 2023-12-02 DIAGNOSIS — E039 Hypothyroidism, unspecified: Secondary | ICD-10-CM | POA: Diagnosis not present

## 2023-12-07 ENCOUNTER — Telehealth: Payer: Self-pay

## 2023-12-07 NOTE — Telephone Encounter (Signed)
 Copied from CRM [id number]. Topic: Clinical - Medication Question >> Dec 07, 2023  1:46 PM Taleah C wrote: Reason for CRM: pt called and stated that three of her prescriptions that she usually gets from [facility] was sent to [facility]. She did receive the meds and stated that they were cheaper at [facility] but she would like for someone to contact her with an explanation as to how it got sent to [facility]. She also stated that she wants to continue using [facility]. Please call and advise.

## 2023-12-08 NOTE — Telephone Encounter (Signed)
 Spoke with the patient and informed her PCP sent the Rxs to [facility] and if in the future, she would prefer to not have these sent to a local pharmacy to let us know and not pick these up from a local pharmacy if she did not prefer.  Patient requests all maintenance Rxs be sent to [facility].

## 2023-12-13 ENCOUNTER — Telehealth: Payer: Self-pay | Admitting: *Deleted

## 2023-12-13 DIAGNOSIS — E782 Mixed hyperlipidemia: Secondary | ICD-10-CM

## 2023-12-13 DIAGNOSIS — F32A Depression, unspecified: Secondary | ICD-10-CM

## 2023-12-13 MED ORDER — ATORVASTATIN CALCIUM 20 MG PO TABS: 20.0000 mg | ORAL_TABLET | Freq: Every day | ORAL | 1 refills | Status: AC

## 2023-12-13 MED ORDER — SERTRALINE HCL 100 MG PO TABS: 150.0000 mg | ORAL_TABLET | Freq: Every day | ORAL | 1 refills | Status: AC

## 2023-12-13 NOTE — Telephone Encounter (Signed)
 Rx done.

## 2024-01-24 DIAGNOSIS — L578 Other skin changes due to chronic exposure to nonionizing radiation: Secondary | ICD-10-CM | POA: Diagnosis not present

## 2024-01-24 DIAGNOSIS — L570 Actinic keratosis: Secondary | ICD-10-CM | POA: Diagnosis not present

## 2024-01-24 DIAGNOSIS — L821 Other seborrheic keratosis: Secondary | ICD-10-CM | POA: Diagnosis not present

## 2024-01-24 DIAGNOSIS — D1801 Hemangioma of skin and subcutaneous tissue: Secondary | ICD-10-CM | POA: Diagnosis not present

## 2024-01-24 DIAGNOSIS — L814 Other melanin hyperpigmentation: Secondary | ICD-10-CM | POA: Diagnosis not present

## 2024-02-06 DIAGNOSIS — F5104 Psychophysiologic insomnia: Secondary | ICD-10-CM | POA: Diagnosis not present

## 2024-02-06 DIAGNOSIS — G8929 Other chronic pain: Secondary | ICD-10-CM | POA: Diagnosis not present

## 2024-02-06 DIAGNOSIS — F32A Depression, unspecified: Secondary | ICD-10-CM | POA: Diagnosis not present

## 2024-02-06 DIAGNOSIS — M791 Myalgia, unspecified site: Secondary | ICD-10-CM | POA: Diagnosis not present

## 2024-02-06 DIAGNOSIS — N183 Chronic kidney disease, stage 3 unspecified: Secondary | ICD-10-CM | POA: Diagnosis not present

## 2024-02-06 DIAGNOSIS — M25562 Pain in left knee: Secondary | ICD-10-CM | POA: Diagnosis not present

## 2024-02-06 DIAGNOSIS — E039 Hypothyroidism, unspecified: Secondary | ICD-10-CM | POA: Diagnosis not present

## 2024-02-06 DIAGNOSIS — E782 Mixed hyperlipidemia: Secondary | ICD-10-CM | POA: Diagnosis not present

## 2024-02-07 ENCOUNTER — Encounter (INDEPENDENT_AMBULATORY_CARE_PROVIDER_SITE_OTHER): Payer: HMO | Admitting: Family Medicine

## 2024-02-07 NOTE — Progress Notes (Signed)
 Error - pt reports has just transferred to [facility] care and did not want this visit today. Reports she did not know she had this appt today and declines.

## 2024-02-09 ENCOUNTER — Other Ambulatory Visit: Payer: Self-pay

## 2024-02-09 ENCOUNTER — Inpatient Hospital Stay (HOSPITAL_COMMUNITY)
Admission: EM | Admit: 2024-02-09 | Discharge: 2024-02-14 | DRG: 522 | Disposition: A | Discharge status: Skilled Nursing Facility | Attending: Family Medicine | Admitting: Family Medicine

## 2024-02-09 ENCOUNTER — Inpatient Hospital Stay (HOSPITAL_COMMUNITY): Admitting: Anesthesiology

## 2024-02-09 ENCOUNTER — Inpatient Hospital Stay (HOSPITAL_COMMUNITY)

## 2024-02-09 ENCOUNTER — Encounter (HOSPITAL_COMMUNITY): Payer: Self-pay | Admitting: Internal Medicine

## 2024-02-09 ENCOUNTER — Emergency Department (HOSPITAL_COMMUNITY)

## 2024-02-09 ENCOUNTER — Encounter (HOSPITAL_COMMUNITY)
Admission: EM | Disposition: A | Discharge status: Skilled Nursing Facility | Payer: Self-pay | Source: Home / Self Care | Attending: Family Medicine

## 2024-02-09 DIAGNOSIS — S199XXA Unspecified injury of neck, initial encounter: Secondary | ICD-10-CM | POA: Diagnosis not present

## 2024-02-09 DIAGNOSIS — E559 Vitamin D deficiency, unspecified: Secondary | ICD-10-CM | POA: Diagnosis not present

## 2024-02-09 DIAGNOSIS — Z299 Encounter for prophylactic measures, unspecified: Secondary | ICD-10-CM | POA: Diagnosis not present

## 2024-02-09 DIAGNOSIS — Z471 Aftercare following joint replacement surgery: Secondary | ICD-10-CM | POA: Diagnosis not present

## 2024-02-09 DIAGNOSIS — S72001A Fracture of unspecified part of neck of right femur, initial encounter for closed fracture: Secondary | ICD-10-CM | POA: Diagnosis not present

## 2024-02-09 DIAGNOSIS — Z01818 Encounter for other preprocedural examination: Secondary | ICD-10-CM | POA: Diagnosis not present

## 2024-02-09 DIAGNOSIS — Z89621 Acquired absence of right hip joint: Secondary | ICD-10-CM | POA: Diagnosis not present

## 2024-02-09 DIAGNOSIS — H9313 Tinnitus, bilateral: Secondary | ICD-10-CM | POA: Diagnosis not present

## 2024-02-09 DIAGNOSIS — S72001D Fracture of unspecified part of neck of right femur, subsequent encounter for closed fracture with routine healing: Secondary | ICD-10-CM | POA: Diagnosis not present

## 2024-02-09 DIAGNOSIS — Y92009 Unspecified place in unspecified non-institutional (private) residence as the place of occurrence of the external cause: Secondary | ICD-10-CM

## 2024-02-09 DIAGNOSIS — K760 Fatty (change of) liver, not elsewhere classified: Secondary | ICD-10-CM | POA: Diagnosis not present

## 2024-02-09 DIAGNOSIS — M25521 Pain in right elbow: Secondary | ICD-10-CM | POA: Diagnosis not present

## 2024-02-09 DIAGNOSIS — M47816 Spondylosis without myelopathy or radiculopathy, lumbar region: Secondary | ICD-10-CM | POA: Diagnosis not present

## 2024-02-09 DIAGNOSIS — N1831 Chronic kidney disease, stage 3a: Secondary | ICD-10-CM | POA: Diagnosis not present

## 2024-02-09 DIAGNOSIS — F32A Depression, unspecified: Secondary | ICD-10-CM | POA: Diagnosis present

## 2024-02-09 DIAGNOSIS — Z8659 Personal history of other mental and behavioral disorders: Secondary | ICD-10-CM | POA: Diagnosis not present

## 2024-02-09 DIAGNOSIS — M62838 Other muscle spasm: Secondary | ICD-10-CM | POA: Diagnosis not present

## 2024-02-09 DIAGNOSIS — K219 Gastro-esophageal reflux disease without esophagitis: Secondary | ICD-10-CM | POA: Diagnosis not present

## 2024-02-09 DIAGNOSIS — R7303 Prediabetes: Secondary | ICD-10-CM | POA: Diagnosis present

## 2024-02-09 DIAGNOSIS — Z79899 Other long term (current) drug therapy: Secondary | ICD-10-CM | POA: Diagnosis not present

## 2024-02-09 DIAGNOSIS — Z885 Allergy status to narcotic agent status: Secondary | ICD-10-CM | POA: Diagnosis not present

## 2024-02-09 DIAGNOSIS — E785 Hyperlipidemia, unspecified: Secondary | ICD-10-CM | POA: Diagnosis not present

## 2024-02-09 DIAGNOSIS — I10 Essential (primary) hypertension: Secondary | ICD-10-CM

## 2024-02-09 DIAGNOSIS — I129 Hypertensive chronic kidney disease with stage 1 through stage 4 chronic kidney disease, or unspecified chronic kidney disease: Secondary | ICD-10-CM | POA: Diagnosis not present

## 2024-02-09 DIAGNOSIS — E039 Hypothyroidism, unspecified: Secondary | ICD-10-CM | POA: Diagnosis not present

## 2024-02-09 DIAGNOSIS — S72011A Unspecified intracapsular fracture of right femur, initial encounter for closed fracture: Principal | ICD-10-CM | POA: Diagnosis present

## 2024-02-09 DIAGNOSIS — Z7401 Bed confinement status: Secondary | ICD-10-CM | POA: Diagnosis not present

## 2024-02-09 DIAGNOSIS — Z23 Encounter for immunization: Secondary | ICD-10-CM | POA: Diagnosis not present

## 2024-02-09 DIAGNOSIS — K5909 Other constipation: Secondary | ICD-10-CM | POA: Diagnosis not present

## 2024-02-09 DIAGNOSIS — Z79890 Hormone replacement therapy (postmenopausal): Secondary | ICD-10-CM

## 2024-02-09 DIAGNOSIS — Z604 Social exclusion and rejection: Secondary | ICD-10-CM | POA: Diagnosis not present

## 2024-02-09 DIAGNOSIS — Z9181 History of falling: Secondary | ICD-10-CM | POA: Diagnosis not present

## 2024-02-09 DIAGNOSIS — W010XXA Fall on same level from slipping, tripping and stumbling without subsequent striking against object, initial encounter: Secondary | ICD-10-CM | POA: Diagnosis present

## 2024-02-09 DIAGNOSIS — Z751 Person awaiting admission to adequate facility elsewhere: Secondary | ICD-10-CM | POA: Diagnosis not present

## 2024-02-09 DIAGNOSIS — W19XXXA Unspecified fall, initial encounter: Secondary | ICD-10-CM | POA: Diagnosis not present

## 2024-02-09 DIAGNOSIS — Z96651 Presence of right artificial knee joint: Secondary | ICD-10-CM | POA: Diagnosis not present

## 2024-02-09 DIAGNOSIS — G3184 Mild cognitive impairment, so stated: Secondary | ICD-10-CM | POA: Diagnosis not present

## 2024-02-09 DIAGNOSIS — S0990XA Unspecified injury of head, initial encounter: Secondary | ICD-10-CM | POA: Diagnosis not present

## 2024-02-09 DIAGNOSIS — Y92008 Other place in unspecified non-institutional (private) residence as the place of occurrence of the external cause: Secondary | ICD-10-CM | POA: Diagnosis not present

## 2024-02-09 DIAGNOSIS — R531 Weakness: Secondary | ICD-10-CM | POA: Diagnosis not present

## 2024-02-09 DIAGNOSIS — Z96641 Presence of right artificial hip joint: Secondary | ICD-10-CM | POA: Diagnosis not present

## 2024-02-09 DIAGNOSIS — G4700 Insomnia, unspecified: Secondary | ICD-10-CM | POA: Diagnosis not present

## 2024-02-09 DIAGNOSIS — M25551 Pain in right hip: Secondary | ICD-10-CM | POA: Diagnosis not present

## 2024-02-09 DIAGNOSIS — Z28311 Partially vaccinated for covid-19: Secondary | ICD-10-CM | POA: Diagnosis not present

## 2024-02-09 HISTORY — PX: TOTAL HIP ARTHROPLASTY: SHX124

## 2024-02-09 LAB — I-STAT CHEM 8, ED
BUN: 24 mg/dL — ABNORMAL HIGH (ref 8–23)
Calcium, Ion: 1.17 mmol/L (ref 1.15–1.40)
Chloride: 107 mmol/L (ref 98–111)
Creatinine, Ser: 1 mg/dL (ref 0.44–1.00)
Glucose, Bld: 100 mg/dL — ABNORMAL HIGH (ref 70–99)
HCT: 36 % (ref 36.0–46.0)
Hemoglobin: 12.2 g/dL (ref 12.0–15.0)
Potassium: 3.8 mmol/L (ref 3.5–5.1)
Sodium: 142 mmol/L (ref 135–145)
TCO2: 23 mmol/L (ref 22–32)

## 2024-02-09 LAB — CBC WITH DIFFERENTIAL/PLATELET
Abs Immature Granulocytes: 0.05 10*3/uL (ref 0.00–0.07)
Basophils Absolute: 0 10*3/uL (ref 0.0–0.1)
Basophils Relative: 0 %
Eosinophils Absolute: 0 10*3/uL (ref 0.0–0.5)
Eosinophils Relative: 0 %
HCT: 37.3 % (ref 36.0–46.0)
Hemoglobin: 12.5 g/dL (ref 12.0–15.0)
Immature Granulocytes: 1 %
Lymphocytes Relative: 10 %
Lymphs Abs: 1 10*3/uL (ref 0.7–4.0)
MCH: 30.6 pg (ref 26.0–34.0)
MCHC: 33.5 g/dL (ref 30.0–36.0)
MCV: 91.4 fL (ref 80.0–100.0)
Monocytes Absolute: 0.4 10*3/uL (ref 0.1–1.0)
Monocytes Relative: 4 %
Neutro Abs: 8.4 10*3/uL — ABNORMAL HIGH (ref 1.7–7.7)
Neutrophils Relative %: 85 %
Platelets: 228 10*3/uL (ref 150–400)
RBC: 4.08 MIL/uL (ref 3.87–5.11)
RDW: 13.3 % (ref 11.5–15.5)
WBC: 10 10*3/uL (ref 4.0–10.5)
nRBC: 0 % (ref 0.0–0.2)

## 2024-02-09 LAB — CBC
HCT: 35.6 % — ABNORMAL LOW (ref 36.0–46.0)
Hemoglobin: 11.5 g/dL — ABNORMAL LOW (ref 12.0–15.0)
MCH: 30.3 pg (ref 26.0–34.0)
MCHC: 32.3 g/dL (ref 30.0–36.0)
MCV: 93.9 fL (ref 80.0–100.0)
Platelets: 210 10*3/uL (ref 150–400)
RBC: 3.79 MIL/uL — ABNORMAL LOW (ref 3.87–5.11)
RDW: 13.5 % (ref 11.5–15.5)
WBC: 8.7 10*3/uL (ref 4.0–10.5)
nRBC: 0 % (ref 0.0–0.2)

## 2024-02-09 LAB — COMPREHENSIVE METABOLIC PANEL WITH GFR
ALT: 23 U/L (ref 0–44)
AST: 31 U/L (ref 15–41)
Albumin: 3.7 g/dL (ref 3.5–5.0)
Alkaline Phosphatase: 46 U/L (ref 38–126)
Anion gap: 9 (ref 5–15)
BUN: 22 mg/dL (ref 8–23)
CO2: 25 mmol/L (ref 22–32)
Calcium: 9 mg/dL (ref 8.9–10.3)
Chloride: 107 mmol/L (ref 98–111)
Creatinine, Ser: 0.87 mg/dL (ref 0.44–1.00)
GFR, Estimated: >60 mL/min (ref >60)
Glucose, Bld: 112 mg/dL — ABNORMAL HIGH (ref 70–99)
Potassium: 3.7 mmol/L (ref 3.5–5.1)
Sodium: 141 mmol/L (ref 135–145)
Total Bilirubin: 0.6 mg/dL (ref 0.0–1.2)
Total Protein: 6.4 g/dL — ABNORMAL LOW (ref 6.5–8.1)

## 2024-02-09 LAB — SURGICAL PCR SCREEN
MRSA, PCR: NEGATIVE
Staphylococcus aureus: NEGATIVE

## 2024-02-09 SURGERY — ARTHROPLASTY, HIP, TOTAL, ANTERIOR APPROACH
Anesthesia: Spinal | Site: Hip | Laterality: Right

## 2024-02-09 MED ORDER — LIDOCAINE 2% (20 MG/ML) 5 ML SYRINGE
INTRAMUSCULAR | Status: DC | PRN
  Administered 2024-02-09: 60 mg via INTRAVENOUS

## 2024-02-09 MED ORDER — SODIUM CHLORIDE 0.9% FLUSH: 3.0000 mL | Freq: Two times a day (BID) | INTRAVENOUS | Status: DC

## 2024-02-09 MED ORDER — LIDOCAINE HCL (PF) 2 % IJ SOLN
INTRAMUSCULAR | Status: AC
  Filled 2024-02-09: qty 5

## 2024-02-09 MED ORDER — TRANEXAMIC ACID-NACL 1000-0.7 MG/100ML-% IV SOLN
1000.0000 mg | Freq: Once | INTRAVENOUS | Status: AC
  Administered 2024-02-09: 1000 mg via INTRAVENOUS

## 2024-02-09 MED ORDER — SODIUM CHLORIDE (PF) 0.9 % IJ SOLN
INTRAMUSCULAR | Status: DC | PRN
  Administered 2024-02-09: 61 mL

## 2024-02-09 MED ORDER — METHOCARBAMOL 500 MG PO TABS
500.0000 mg | ORAL_TABLET | Freq: Four times a day (QID) | ORAL | Status: DC | PRN
  Administered 2024-02-09 – 2024-02-13 (×6): 500 mg via ORAL
  Filled 2024-02-09 (×7): qty 1

## 2024-02-09 MED ORDER — ACETAMINOPHEN 650 MG RE SUPP: 650.0000 mg | Freq: Four times a day (QID) | RECTAL | Status: DC | PRN

## 2024-02-09 MED ORDER — PROPOFOL 10 MG/ML IV BOLUS
INTRAVENOUS | Status: DC | PRN
  Administered 2024-02-09: 30 mg via INTRAVENOUS
  Administered 2024-02-09: 50 ug/kg/min via INTRAVENOUS
  Administered 2024-02-09: 40 mg via INTRAVENOUS

## 2024-02-09 MED ORDER — PHENOL 1.4 % MT LIQD: 1.0000 | OROMUCOSAL | Status: DC | PRN

## 2024-02-09 MED ORDER — METHOCARBAMOL 1000 MG/10ML IJ SOLN: 500.0000 mg | Freq: Four times a day (QID) | INTRAMUSCULAR | Status: DC | PRN

## 2024-02-09 MED ORDER — PHENYLEPHRINE HCL-NACL 20-0.9 MG/250ML-% IV SOLN
INTRAVENOUS | Status: DC | PRN
  Administered 2024-02-09: 50 ug/min via INTRAVENOUS

## 2024-02-09 MED ORDER — ACETAMINOPHEN 10 MG/ML IV SOLN
INTRAVENOUS | Status: AC
  Filled 2024-02-09: qty 100

## 2024-02-09 MED ORDER — LEVOTHYROXINE SODIUM 50 MCG PO TABS: 50.0000 ug | ORAL_TABLET | ORAL | Status: DC

## 2024-02-09 MED ORDER — FENTANYL CITRATE PF 50 MCG/ML IJ SOSY
12.5000 ug | PREFILLED_SYRINGE | INTRAMUSCULAR | Status: DC | PRN
Start: 2024-02-09 — End: 2024-02-09

## 2024-02-09 MED ORDER — SODIUM CHLORIDE 0.9% FLUSH: 3.0000 mL | INTRAVENOUS | Status: DC | PRN

## 2024-02-09 MED ORDER — HYDROMORPHONE HCL 1 MG/ML IJ SOLN: 0.5000 mg | INTRAMUSCULAR | Status: DC | PRN

## 2024-02-09 MED ORDER — OXYCODONE HCL 5 MG PO TABS: 5.0000 mg | ORAL_TABLET | Freq: Once | ORAL | Status: DC | PRN

## 2024-02-09 MED ORDER — ONDANSETRON HCL 4 MG/2ML IJ SOLN: 4.0000 mg | Freq: Four times a day (QID) | INTRAMUSCULAR | Status: DC | PRN

## 2024-02-09 MED ORDER — DIPHENHYDRAMINE HCL 12.5 MG/5ML PO ELIX: 12.5000 mg | ORAL_SOLUTION | ORAL | Status: DC | PRN

## 2024-02-09 MED ORDER — SODIUM CHLORIDE 0.9 % IV SOLN: 250.0000 mL | INTRAVENOUS | Status: DC | PRN

## 2024-02-09 MED ORDER — FENTANYL CITRATE PF 50 MCG/ML IJ SOSY
50.0000 ug | PREFILLED_SYRINGE | Freq: Once | INTRAMUSCULAR | Status: AC
  Administered 2024-02-09: 50 ug via INTRAVENOUS
  Filled 2024-02-09: qty 1

## 2024-02-09 MED ORDER — BUPIVACAINE-EPINEPHRINE (PF) 0.25% -1:200000 IJ SOLN
INTRAMUSCULAR | Status: AC
  Filled 2024-02-09: qty 30

## 2024-02-09 MED ORDER — OXYCODONE HCL 5 MG PO TABS
5.0000 mg | ORAL_TABLET | ORAL | Status: DC | PRN
  Administered 2024-02-09: 5 mg via ORAL
  Filled 2024-02-09: qty 1

## 2024-02-09 MED ORDER — ASPIRIN 81 MG PO CHEW
81.0000 mg | CHEWABLE_TABLET | Freq: Two times a day (BID) | ORAL | Status: DC
  Administered 2024-02-09 – 2024-02-14 (×10): 81 mg via ORAL
  Filled 2024-02-09 (×10): qty 1

## 2024-02-09 MED ORDER — ACETAMINOPHEN 500 MG PO TABS
1000.0000 mg | ORAL_TABLET | Freq: Four times a day (QID) | ORAL | Status: AC
  Administered 2024-02-10 (×3): 1000 mg via ORAL
  Filled 2024-02-09 (×4): qty 2

## 2024-02-09 MED ORDER — OXYCODONE HCL 5 MG PO TABS
5.0000 mg | ORAL_TABLET | ORAL | Status: DC | PRN
  Administered 2024-02-09 – 2024-02-13 (×4): 5 mg via ORAL
  Filled 2024-02-09 (×4): qty 1

## 2024-02-09 MED ORDER — TRANEXAMIC ACID-NACL 1000-0.7 MG/100ML-% IV SOLN
INTRAVENOUS | Status: AC
  Filled 2024-02-09: qty 100

## 2024-02-09 MED ORDER — METOCLOPRAMIDE HCL 5 MG PO TABS: 5.0000 mg | ORAL_TABLET | Freq: Three times a day (TID) | ORAL | Status: DC | PRN

## 2024-02-09 MED ORDER — HYDROMORPHONE HCL 1 MG/ML IJ SOLN
1.0000 mg | Freq: Once | INTRAMUSCULAR | Status: DC
  Filled 2024-02-09: qty 1

## 2024-02-09 MED ORDER — ONDANSETRON HCL 4 MG PO TABS: 4.0000 mg | ORAL_TABLET | Freq: Four times a day (QID) | ORAL | Status: DC | PRN

## 2024-02-09 MED ORDER — FENTANYL CITRATE PF 50 MCG/ML IJ SOSY: 12.5000 ug | PREFILLED_SYRINGE | INTRAMUSCULAR | Status: DC | PRN

## 2024-02-09 MED ORDER — HEPARIN SODIUM (PORCINE) 5000 UNIT/ML IJ SOLN
5000.0000 [IU] | Freq: Three times a day (TID) | INTRAMUSCULAR | Status: DC
  Administered 2024-02-09: 5000 [IU] via SUBCUTANEOUS
  Filled 2024-02-09: qty 1

## 2024-02-09 MED ORDER — MORPHINE SULFATE (PF) 4 MG/ML IV SOLN
4.0000 mg | Freq: Once | INTRAVENOUS | Status: AC
  Administered 2024-02-09: 4 mg via INTRAVENOUS
  Filled 2024-02-09: qty 1

## 2024-02-09 MED ORDER — ACETAMINOPHEN 325 MG PO TABS: 650.0000 mg | ORAL_TABLET | Freq: Four times a day (QID) | ORAL | Status: DC | PRN

## 2024-02-09 MED ORDER — OXYCODONE HCL 5 MG PO TABS: 10.0000 mg | ORAL_TABLET | ORAL | Status: DC | PRN

## 2024-02-09 MED ORDER — ORAL CARE MOUTH RINSE: 15.0000 mL | Freq: Once | OROMUCOSAL | Status: AC

## 2024-02-09 MED ORDER — POVIDONE-IODINE 10 % EX SWAB: 2.0000 | Freq: Once | CUTANEOUS | Status: DC

## 2024-02-09 MED ORDER — SODIUM CHLORIDE (PF) 0.9 % IJ SOLN
INTRAMUSCULAR | Status: AC
  Filled 2024-02-09: qty 50

## 2024-02-09 MED ORDER — ALUM & MAG HYDROXIDE-SIMETH 200-200-20 MG/5ML PO SUSP: 30.0000 mL | ORAL | Status: DC | PRN

## 2024-02-09 MED ORDER — SENNA 8.6 MG PO TABS
1.0000 | ORAL_TABLET | Freq: Two times a day (BID) | ORAL | Status: DC
  Administered 2024-02-09 – 2024-02-14 (×7): 8.6 mg via ORAL
  Filled 2024-02-09 (×7): qty 1

## 2024-02-09 MED ORDER — SODIUM CHLORIDE 0.9 % IV SOLN: INTRAVENOUS | Status: DC

## 2024-02-09 MED ORDER — CEFAZOLIN SODIUM-DEXTROSE 2-4 GM/100ML-% IV SOLN
2.0000 g | INTRAVENOUS | Status: AC
  Administered 2024-02-09: 2 g via INTRAVENOUS
  Filled 2024-02-09: qty 100

## 2024-02-09 MED ORDER — PHENYLEPHRINE 80 MCG/ML (10ML) SYRINGE FOR IV PUSH (FOR BLOOD PRESSURE SUPPORT)
PREFILLED_SYRINGE | INTRAVENOUS | Status: DC | PRN
  Administered 2024-02-09: 80 ug via INTRAVENOUS
  Administered 2024-02-09 (×2): 160 ug via INTRAVENOUS
  Administered 2024-02-09: 80 ug via INTRAVENOUS

## 2024-02-09 MED ORDER — LEVOTHYROXINE SODIUM 25 MCG PO TABS: 75.0000 ug | ORAL_TABLET | ORAL | Status: DC

## 2024-02-09 MED ORDER — OXYCODONE HCL 5 MG/5ML PO SOLN: 5.0000 mg | Freq: Once | ORAL | Status: DC | PRN

## 2024-02-09 MED ORDER — ISOPROPYL ALCOHOL 70 % SOLN
Status: DC | PRN
  Administered 2024-02-09: 1 via TOPICAL

## 2024-02-09 MED ORDER — MEMANTINE HCL 10 MG PO TABS
5.0000 mg | ORAL_TABLET | Freq: Two times a day (BID) | ORAL | Status: DC
  Administered 2024-02-09 – 2024-02-14 (×11): 5 mg via ORAL
  Filled 2024-02-09 (×11): qty 1

## 2024-02-09 MED ORDER — LACTATED RINGERS IV SOLN: INTRAVENOUS | Status: DC

## 2024-02-09 MED ORDER — FENTANYL CITRATE PF 50 MCG/ML IJ SOSY
12.5000 ug | PREFILLED_SYRINGE | INTRAMUSCULAR | Status: DC | PRN
  Administered 2024-02-09: 50 ug via INTRAVENOUS
  Filled 2024-02-09: qty 1

## 2024-02-09 MED ORDER — LEVOTHYROXINE SODIUM 50 MCG PO TABS
50.0000 ug | ORAL_TABLET | ORAL | Status: DC
  Administered 2024-02-09 – 2024-02-14 (×4): 50 ug via ORAL
  Filled 2024-02-09 (×4): qty 1

## 2024-02-09 MED ORDER — BUPIVACAINE IN DEXTROSE 0.75-8.25 % IT SOLN
INTRATHECAL | Status: DC | PRN
Start: 2024-02-09 — End: 2024-02-09
  Administered 2024-02-09: 1.8 mL via INTRATHECAL

## 2024-02-09 MED ORDER — DOCUSATE SODIUM 100 MG PO CAPS
100.0000 mg | ORAL_CAPSULE | Freq: Two times a day (BID) | ORAL | Status: DC
  Administered 2024-02-09 – 2024-02-14 (×10): 100 mg via ORAL
  Filled 2024-02-09 (×10): qty 1

## 2024-02-09 MED ORDER — WATER FOR IRRIGATION, STERILE IR SOLN
Status: DC | PRN
Start: 2024-02-09 — End: 2024-02-09
  Administered 2024-02-09: 1000 mL

## 2024-02-09 MED ORDER — AMISULPRIDE (ANTIEMETIC) 5 MG/2ML IV SOLN: 10.0000 mg | Freq: Once | INTRAVENOUS | Status: DC | PRN

## 2024-02-09 MED ORDER — ATORVASTATIN CALCIUM 10 MG PO TABS: 20.0000 mg | ORAL_TABLET | Freq: Every day | ORAL | Status: DC

## 2024-02-09 MED ORDER — ACETAMINOPHEN 325 MG PO TABS
325.0000 mg | ORAL_TABLET | Freq: Four times a day (QID) | ORAL | Status: DC | PRN
  Administered 2024-02-10: 325 mg via ORAL
  Administered 2024-02-11 – 2024-02-13 (×3): 650 mg via ORAL
  Filled 2024-02-09 (×4): qty 2

## 2024-02-09 MED ORDER — POLYETHYLENE GLYCOL 3350 17 G PO PACK: 17.0000 g | PACK | Freq: Every day | ORAL | Status: DC | PRN

## 2024-02-09 MED ORDER — ACETAMINOPHEN 10 MG/ML IV SOLN
INTRAVENOUS | Status: DC | PRN
  Administered 2024-02-09: 1000 mg via INTRAVENOUS

## 2024-02-09 MED ORDER — METHOCARBAMOL 1000 MG/10ML IJ SOLN
500.0000 mg | Freq: Four times a day (QID) | INTRAMUSCULAR | Status: DC | PRN
  Administered 2024-02-09: 500 mg via INTRAVENOUS
  Filled 2024-02-09: qty 10

## 2024-02-09 MED ORDER — ONDANSETRON HCL 4 MG/2ML IJ SOLN
INTRAMUSCULAR | Status: AC
  Filled 2024-02-09: qty 2

## 2024-02-09 MED ORDER — ACETAMINOPHEN 500 MG PO TABS: 1000.0000 mg | ORAL_TABLET | Freq: Once | ORAL | Status: DC

## 2024-02-09 MED ORDER — CHLORHEXIDINE GLUCONATE 4 % EX SOLN: 60.0000 mL | Freq: Once | CUTANEOUS | Status: DC

## 2024-02-09 MED ORDER — ACETAMINOPHEN 10 MG/ML IV SOLN
1000.0000 mg | Freq: Once | INTRAVENOUS | Status: AC
  Administered 2024-02-09: 1000 mg via INTRAVENOUS
  Filled 2024-02-09: qty 100

## 2024-02-09 MED ORDER — CEFAZOLIN SODIUM-DEXTROSE 2-4 GM/100ML-% IV SOLN
2.0000 g | Freq: Four times a day (QID) | INTRAVENOUS | Status: AC
  Administered 2024-02-09 – 2024-02-10 (×2): 2 g via INTRAVENOUS
  Filled 2024-02-09 (×2): qty 100

## 2024-02-09 MED ORDER — CHLORHEXIDINE GLUCONATE 0.12 % MT SOLN
15.0000 mL | Freq: Once | OROMUCOSAL | Status: AC
  Administered 2024-02-09: 15 mL via OROMUCOSAL

## 2024-02-09 MED ORDER — TRANEXAMIC ACID-NACL 1000-0.7 MG/100ML-% IV SOLN
1000.0000 mg | INTRAVENOUS | Status: AC
  Administered 2024-02-09: 1000 mg via INTRAVENOUS
  Filled 2024-02-09: qty 100

## 2024-02-09 MED ORDER — METOCLOPRAMIDE HCL 5 MG/ML IJ SOLN: 5.0000 mg | Freq: Three times a day (TID) | INTRAMUSCULAR | Status: DC | PRN

## 2024-02-09 MED ORDER — HYDROXYZINE HCL 25 MG PO TABS: 25.0000 mg | ORAL_TABLET | Freq: Four times a day (QID) | ORAL | Status: DC | PRN

## 2024-02-09 MED ORDER — SODIUM CHLORIDE 0.9 % IR SOLN
Status: DC | PRN
  Administered 2024-02-09: 1000 mL

## 2024-02-09 MED ORDER — MENTHOL 3 MG MT LOZG: 1.0000 | LOZENGE | OROMUCOSAL | Status: DC | PRN

## 2024-02-09 MED ORDER — SERTRALINE HCL 50 MG PO TABS
150.0000 mg | ORAL_TABLET | Freq: Every day | ORAL | Status: DC
  Administered 2024-02-09 – 2024-02-14 (×6): 150 mg via ORAL
  Filled 2024-02-09 (×6): qty 1

## 2024-02-09 MED ORDER — KETOROLAC TROMETHAMINE 30 MG/ML IJ SOLN
INTRAMUSCULAR | Status: AC
  Filled 2024-02-09: qty 1

## 2024-02-09 MED ORDER — HYDROXYZINE PAMOATE 25 MG PO CAPS: 25.0000 mg | ORAL_CAPSULE | Freq: Four times a day (QID) | ORAL | Status: DC | PRN

## 2024-02-09 MED ORDER — FENTANYL CITRATE PF 50 MCG/ML IJ SOSY: 25.0000 ug | PREFILLED_SYRINGE | INTRAMUSCULAR | Status: DC | PRN

## 2024-02-09 MED ORDER — LEVOTHYROXINE SODIUM 75 MCG PO TABS
75.0000 ug | ORAL_TABLET | ORAL | Status: DC
  Administered 2024-02-10 – 2024-02-13 (×2): 75 ug via ORAL
  Filled 2024-02-09 (×2): qty 1

## 2024-02-09 SURGICAL SUPPLY — 49 items
BAG COUNTER SPONGE SURGICOUNT (BAG) IMPLANT
BAG ZIPLOCK 12X15 (MISCELLANEOUS) IMPLANT
CHLORAPREP W/TINT 26 (MISCELLANEOUS) ×1 IMPLANT
COVER PERINEAL POST (MISCELLANEOUS) ×1 IMPLANT
COVER SURGICAL LIGHT HANDLE (MISCELLANEOUS) ×1 IMPLANT
DERMABOND ADVANCED .7 DNX12 (GAUZE/BANDAGES/DRESSINGS) ×2 IMPLANT
DRAPE IMP U-DRAPE 54X76 (DRAPES) ×1 IMPLANT
DRAPE SHEET LG 3/4 BI-LAMINATE (DRAPES) ×3 IMPLANT
DRAPE STERI IOBAN 125X83 (DRAPES) ×1 IMPLANT
DRAPE U-SHAPE 47X51 STRL (DRAPES) ×1 IMPLANT
DRSG AQUACEL AG ADV 3.5X10 (GAUZE/BANDAGES/DRESSINGS) ×1 IMPLANT
ELECT REM PT RETURN 15FT ADLT (MISCELLANEOUS) ×1 IMPLANT
GAUZE SPONGE 4X4 12PLY STRL (GAUZE/BANDAGES/DRESSINGS) ×1 IMPLANT
GLOVE BIO SURGEON STRL SZ7 (GLOVE) ×1 IMPLANT
GLOVE BIO SURGEON STRL SZ8.5 (GLOVE) ×2 IMPLANT
GLOVE BIOGEL PI IND STRL 7.5 (GLOVE) ×1 IMPLANT
GLOVE BIOGEL PI IND STRL 8.5 (GLOVE) ×1 IMPLANT
GOWN SPEC L3 XXLG W/TWL (GOWN DISPOSABLE) ×1 IMPLANT
GOWN STRL REUS W/ TWL XL LVL3 (GOWN DISPOSABLE) ×1 IMPLANT
HEAD FEM -6XOFST 36XMDLR (Orthopedic Implant) IMPLANT
HOLDER FOLEY CATH W/STRAP (MISCELLANEOUS) ×1 IMPLANT
HOOD PEEL AWAY T7 (MISCELLANEOUS) ×3 IMPLANT
KIT TURNOVER KIT A (KITS) IMPLANT
LINER ACETAB G7 E 36 +5 OFFSET (Liner) IMPLANT
MANIFOLD NEPTUNE II (INSTRUMENTS) ×1 IMPLANT
MARKER SKIN DUAL TIP RULER LAB (MISCELLANEOUS) ×1 IMPLANT
NDL SAFETY ECLIPSE 18X1.5 (NEEDLE) ×1 IMPLANT
NDL SPNL 18GX3.5 QUINCKE PK (NEEDLE) ×1 IMPLANT
NEEDLE SPNL 18GX3.5 QUINCKE PK (NEEDLE) ×1 IMPLANT
PACK ANTERIOR HIP CUSTOM (KITS) ×1 IMPLANT
PENCIL SMOKE EVACUATOR (MISCELLANEOUS) ×1 IMPLANT
SAW OSC TIP CART 19.5X105X1.3 (SAW) ×1 IMPLANT
SEALER BIPOLAR AQUA 6.0 (INSTRUMENTS) ×1 IMPLANT
SET HNDPC FAN SPRY TIP SCT (DISPOSABLE) ×1 IMPLANT
SHELL ACETAB 3H 52 E HIP (Shell) IMPLANT
SOLUTION PRONTOSAN WOUND 350ML (IRRIGATION / IRRIGATOR) ×1 IMPLANT
SPIKE FLUID TRANSFER (MISCELLANEOUS) ×1 IMPLANT
STAPLER SKIN PROX WIDE 3.9 (STAPLE) IMPLANT
STEM FEM TAPERLOC CMTLS 17X140 (Stem) IMPLANT
SUT MNCRL AB 3-0 PS2 18 (SUTURE) ×1 IMPLANT
SUT MON AB 2-0 CT1 36 (SUTURE) ×1 IMPLANT
SUT STRATAFIX 14 PDO 48 VLT (SUTURE) ×1 IMPLANT
SUT STRATAFIX PDO 1 14 VIOLET (SUTURE) ×1 IMPLANT
SUT VIC AB 2-0 CT1 TAPERPNT 27 (SUTURE) IMPLANT
SYR 3ML LL SCALE MARK (SYRINGE) ×1 IMPLANT
TOWEL GREEN STERILE FF (TOWEL DISPOSABLE) ×1 IMPLANT
TRAY FOLEY MTR SLVR 16FR STAT (SET/KITS/TRAYS/PACK) IMPLANT
TUBE SUCTION HIGH CAP CLEAR NV (SUCTIONS) ×1 IMPLANT
WATER STERILE IRR 1000ML POUR (IV SOLUTION) ×1 IMPLANT

## 2024-02-09 NOTE — Anesthesia Procedure Notes (Signed)
 Spinal  Patient location during procedure: OR Start time: 02/09/2024 1:30 PM End time: 02/09/2024 1:38 PM Reason for block: surgical anesthesia Staffing Performed: anesthesiologist  Anesthesiologist: Grace Laura, MD Performed by: Grace Laura, MD Authorized by: Grace Laura, MD   Preanesthetic Checklist Completed: patient identified, IV checked, risks and benefits discussed, surgical consent, monitors and equipment checked, pre-op evaluation and timeout performed Spinal Block Patient position: sitting Prep: DuraPrep and site prepped and draped Patient monitoring: cardiac monitor, continuous pulse ox and blood pressure Approach: right paramedian Location: L3-4 Injection technique: single-shot Needle Needle type: Quincke  Needle gauge: 22 G Needle length: 9 cm Assessment Sensory level: T6 Events: CSF return Additional Notes Functioning IV was confirmed and monitors were applied. Sterile prep and drape, including hand hygiene and sterile gloves were used. The patient was positioned and the spine was prepped. The skin was anesthetized with lidocaine.  Free flow of clear CSF was obtained prior to injecting local anesthetic into the CSF.  The spinal needle aspirated freely following injection.  The needle was carefully withdrawn.  The patient tolerated the procedure well.   Difficult spinal placement. Attempts at L2/3, L3/4, and L4/5. Success at L4/5 right paramedian with 22G Quincke. Pt tolerated well. No complications.

## 2024-02-09 NOTE — Transfer of Care (Signed)
 Immediate Anesthesia Transfer of Care Note  Patient: Tasha Scott  Procedure(s) Performed: ARTHROPLASTY, HIP, TOTAL, ANTERIOR APPROACH (Right: Hip)  Patient Location: PACU  Anesthesia Type:MAC and Spinal  Level of Consciousness: awake and patient cooperative  Airway & Oxygen Therapy: Patient Spontanous Breathing and Patient connected to face mask oxygen  Post-op Assessment: Report given to RN and Post -op Vital signs reviewed and stable  Post vital signs: Reviewed and stable  Last Vitals:  Vitals Value Taken Time  BP 105/50 02/09/24 1530  Temp 36.4 C 02/09/24 1524  Pulse 62 02/09/24 1531  Resp 14 02/09/24 1531  SpO2 100 % 02/09/24 1531  Vitals shown include unfiled device data.  Last Pain:  Vitals:   02/09/24 0815  TempSrc:   PainSc: 6       Patients Stated Pain Goal: 0 (02/09/24 0600)  Complications: No notable events documented.

## 2024-02-09 NOTE — Anesthesia Preprocedure Evaluation (Addendum)
 Anesthesia Evaluation  Patient identified by MRN, date of birth, ID band Patient awake    Reviewed: Allergy & Precautions, NPO status , Patient's Chart, lab work & pertinent test results  Airway Mallampati: III  TM Distance: >3 FB Neck ROM: Full    Dental no notable dental hx. (+) Teeth Intact, Dental Advisory Given   Pulmonary neg pulmonary ROS   Pulmonary exam normal breath sounds clear to auscultation       Cardiovascular hypertension, Normal cardiovascular exam Rhythm:Regular Rate:Normal     Neuro/Psych  PSYCHIATRIC DISORDERS  Depression    negative neurological ROS     GI/Hepatic Neg liver ROS,GERD  ,,  Endo/Other  Hypothyroidism    Renal/GU Renal InsufficiencyRenal disease  negative genitourinary   Musculoskeletal  (+) Arthritis ,    Abdominal   Peds  Hematology negative hematology ROS (+)   Anesthesia Other Findings   Reproductive/Obstetrics                             Anesthesia Physical Anesthesia Plan  ASA: 2  Anesthesia Plan: Spinal   Post-op Pain Management: Ofirmev IV (intra-op)*   Induction:   PONV Risk Score and Plan: 2 and Treatment may vary due to age or medical condition, Propofol infusion and Ondansetron  Airway Management Planned: Natural Airway  Additional Equipment:   Intra-op Plan:   Post-operative Plan:   Informed Consent: I have reviewed the patients History and Physical, chart, labs and discussed the procedure including the risks, benefits and alternatives for the proposed anesthesia with the patient or authorized representative who has indicated his/her understanding and acceptance.     Dental advisory given  Plan Discussed with: CRNA  Anesthesia Plan Comments:         Anesthesia Quick Evaluation

## 2024-02-09 NOTE — ED Triage Notes (Signed)
 Pt BIB [facility] from home following a mechianical fall when slipping on her socks. Reports falling onto her right side. C/o right hip pain and right elbow pain. No head injury. NO neck pain. No LOC.

## 2024-02-09 NOTE — Care Plan (Signed)
 This 88 years old female with PMH significant for prediabetes, hyperlipidemia, hypothyroidism, mild cognitive impairment and depressive disorder presented in the ED status post mechanical fall after slipping on her socks, She landed on her right side.  She was unable to stand,  complaining about having right-sided hip pain and right elbow pain.  She denies any head injury or loss of consciousness.  In the ED, She is found to have acute displaced impacted subcapital femoral neck fracture on the right femur.  CT lumbar spine,  C-spine and CT head unremarkable. Patient was admitted for further evaluation.  Patient is scheduled for ORIF today.  Assessment and plan: Acute displaced right femoral neck fracture status post mechanical fall: Patient presented status post mechanical fall on her sock.  She landed on her right side. CT head, CT cervical spine and CT lumbar spine unremarkable. CT pelvis showed acute displaced impacted subcapital femoral neck fracture of the right femur. Continue adequate pain control. Orthopedic surgeon Dr. Charol Copas has been consulted by ED physician Dr. Maralee Senate.  Patient is tentatively scheduled for ORIF today.   Hypothyroidism: Continue levothyroxine 50 mcg TTS schedule and 75 mcg MWF schedule   Essential hypertension: Not on any antihypertensive medication. Start hydralazine as needed.    Mild cognitive impairment Continue memantine  twice daily   History of depression Continue Zoloft  and Atarax  as needed   Hyperlipidemia Can restart Lipitor postsurgery.

## 2024-02-09 NOTE — TOC Initial Note (Signed)
 Transition of Care [facility]) - Initial/Assessment Note    Patient Details  Name: Tasha Scott MRN: [id number] Date of Birth: January 30, 1936  Transition of Care [facility]) CM/SW Contact:    Bari Leys, RN Phone Number: 02/09/2024, 9:30 AM  Clinical Narrative: Met with patient at bedside to introduce role of TOC/NCM and review for dc planning, pt reports she has an established PCP and pharmacy, no current home care services, patient reports she is independent in her self care and uses a cane as needed for functional mobility, reports she has a sister who resides [facility]. TOC will continue to follow.                    Expected Discharge Plan: Skilled Nursing Facility     Patient Goals and CMS Choice Patient states their goals for this hospitalization and ongoing recovery are:: return home          Expected Discharge Plan and Services       Living arrangements for the past 2 months: Single Family Home                                      Prior Living Arrangements/Services Living arrangements for the past 2 months: Single Family Home Lives with:: Self Patient language and need for interpreter reviewed:: Yes Do you feel safe going back to the place where you live?: Yes      Need for Family Participation in Patient Care: Yes (Comment) Care giver support system in place?: Yes (comment) Current home services: DME [facility] Mighty) Criminal Activity/Legal Involvement Pertinent to Current Situation/Hospitalization: No - Comment as needed  Activities of Daily Living   ADL Screening (condition at time of admission) Independently performs ADLs?: No Does the patient have a NEW difficulty with bathing/dressing/toileting/self-feeding that is expected to last >3 days?: No Does the patient have a NEW difficulty with getting in/out of bed, walking, or climbing stairs that is expected to last >3 days?: No Does the patient have a NEW difficulty with communication that is expected to last >3  days?: No Is the patient deaf or have difficulty hearing?: No Does the patient have difficulty seeing, even when wearing glasses/contacts?: No Does the patient have difficulty concentrating, remembering, or making decisions?: No  Permission Sought/Granted                  Emotional Assessment Appearance:: Appears stated age Attitude/Demeanor/Rapport: Engaged Affect (typically observed): Accepting Orientation: : Oriented to Self, Oriented to Place, Oriented to  Time, Oriented to Situation Alcohol / Substance Use: Not Applicable Psych Involvement: No (comment)  Admission diagnosis:  Fall, initial encounter [W19.XXXA] Closed fracture of right hip, initial encounter (HCC) [S72.001A] Closed displaced fracture of right femoral neck (HCC) [S72.001A] Patient Active Problem List   Diagnosis Date Noted   Closed displaced fracture of right femoral neck (HCC) 02/09/2024   Fall at home, initial encounter 02/09/2024   Essential hypertension 02/09/2024   History of depression 02/09/2024   Acute insomnia 05/31/2023   Mild cognitive impairment 05/27/2023   Tinnitus of both ears 11/01/2022   Depressive disorder 10/29/2021   Steatosis of liver 10/29/2021   Hypothyroidism 10/27/2021   Chronic kidney disease, stage 3a (HCC) 10/27/2021   Hyperlipidemia 10/27/2021   Prediabetes 10/27/2021   PCP:  Aida House, MD Pharmacy:   [facility] [phone] - [place], [place] - [phone]  AT [facility] [address] [place] [address] Phone: [phone] Fax: [phone]  [facility] - [place], [place] - [address] [place] [address] Phone: [phone] Fax: [phone]     Social Drivers of Health (SDOH) Social History: SDOH Screenings   Food Insecurity: No Food Insecurity (02/09/2024)  Housing: Low Risk  (02/09/2024)  Transportation Needs: No Transportation Needs (02/09/2024)  Utilities: Not At  Risk (02/09/2024)  Social Connections: Socially Isolated (02/09/2024)  Tobacco Use: Unknown (02/09/2024)   SDOH Interventions:     Readmission Risk Interventions    02/09/2024    9:29 AM  Readmission Risk Prevention Plan  Post Dischage Appt Complete  Medication Screening Complete  Transportation Screening Complete

## 2024-02-09 NOTE — Discharge Instructions (Signed)

## 2024-02-09 NOTE — H&P (Signed)
 History and Physical    Tasha Scott:[id number] DOB: [birth year] DOA: 02/09/2024  PCP: Aida House, MD   Patient coming from: Home   Chief Complaint:  Chief Complaint  Patient presents with   Fall   ED TRIAGE note:Pt BIB [facility] from home following a mechianical fall when slipping on her socks. Reports falling onto her right side. C/o right hip pain and right elbow pain. No head injury. NO neck pain. No LOC.   HPI:  Tasha Scott is a 88 y.o. female with medical history significant of prediabetes, hyperlipidemia, hypothyroidism, mild cognitive impairment and depressive disorder present emergency department complaining of a mechanical fall after slipping on her socks landed on the right sided hip.  Since the fall patient is complaining about right-sided hip pain and right-sided elbow pain.  Denies any head injury and loss of consciousness. Patient is complaining about significant right-sided hip joint pain.  There is no other complaint at this time.   ED Course:  At presentation to ED patient found to have elevated blood pressure 160/73 otherwise hemodynamically stable. BMP unremarkable except slight elevated BUN 24. CBC unremarkable.  CT pelvis showed: Acute displaced impacted subcapital femoral neck fracture of the right femur. CT lumbar spine no evidence of fracture and traumatic listhesis. CT cervical spine no evidence of acute fracture and malalignment of cervical spine CT head no acute intracranial abnormality.  The ED patient has been given fentanyl 50 mcg and morphine 4 mg.  Dr. Maralee Senate consulted orthopedic surgeon Dr. Dr. Charol Copas who recommended to keep patient n.p.o. and see patient in the morning.  Hospitalist has been consulted for further evaluation management of mechanical fall which is causing right femoral neck fracture.   Significant labs in the ED: Lab Orders         CBC with Differential         Comprehensive metabolic panel         CBC          I-stat chem 8, ED (not at [facility], [facility] or [facility])       Review of Systems:  Review of Systems  Constitutional:  Negative for chills, fever, malaise/fatigue and weight loss.  Respiratory:  Negative for cough, sputum production and shortness of breath.   Cardiovascular:  Negative for chest pain and palpitations.  Gastrointestinal:  Negative for heartburn and nausea.  Musculoskeletal:  Positive for falls and joint pain. Negative for back pain, myalgias and neck pain.       Right-sided hip joint pain  Neurological:  Negative for dizziness and headaches.  Psychiatric/Behavioral:  The patient is not nervous/anxious.     Past Medical History:  Diagnosis Date   Acid reflux    Arthritis    Thyroid  disease     Past Surgical History:  Procedure Laterality Date   bladder tack     2007   KNEE SURGERY Right    left     reports that she has never smoked. She does not have any smokeless tobacco history on file. She reports current alcohol use. She reports that she does not use drugs.  Allergies  Allergen Reactions   Codeine     Hallucinations, dizziness    History reviewed. No pertinent family history.  Prior to Admission medications   Medication Sig Start Date End Date Taking? Authorizing Provider  atorvastatin  (LIPITOR) 20 MG tablet Take 1 tablet (20 mg total) by mouth daily. 12/13/23   Aida House, MD  b complex vitamins  capsule Take 1 capsule by mouth daily.    [provider]  Cholecalciferol (VITAMIN D3) 125 MCG (5000 UT) CAPS Take by mouth.    [provider]  Diclofenac Sodium CR 100 MG 24 hr tablet Take 100 mg by mouth daily as needed for pain.    [provider]  hydrOXYzine  (VISTARIL ) 25 MG capsule Take 1 capsule (25 mg total) by mouth every 8 (eight) hours as needed. 05/27/23   Aida House, MD  memantine  (NAMENDA ) 5 MG tablet Take 1 tablet (5 mg total) by mouth daily for 30 days, THEN 1 tablet (5 mg total) 2 (two) times daily. 11/28/23  01/27/24  Aida House, MD  Multiple Vitamins-Minerals (WOMENS MULTIVITAMIN) TABS Take 1 tablet by mouth daily.    [provider]  Nutritional Supplements (JUICE PLUS FIBRE PO) Take 2 capsules by mouth in the morning and at bedtime.    [provider]  Probiotic Product (PROBIOTIC-10 PO) Take 1 capsule by mouth daily.    [provider]  sertraline  (ZOLOFT ) 100 MG tablet Take 1.5 tablets (150 mg total) by mouth daily. 12/13/23   Aida House, MD  SYNTHROID  50 MCG tablet Take 1 tablet (50 mcg total) by mouth every Tuesday, Thursday, Saturday, and Sunday. 11/29/23   Aida House, MD  SYNTHROID  75 MCG tablet Take 1 tablet (75 mcg total) by mouth every Monday, Wednesday, and Friday. 11/28/23   Aida House, MD     Physical Exam: Vitals:   02/09/24 0158  BP: (!) 168/73  Pulse: 64  Resp: 17  Temp: (!) 97.5 F (36.4 C)  TempSrc: Oral  SpO2: 100%    Physical Exam Nursing note reviewed.  Constitutional:      General: She is in acute distress.     Appearance: She is ill-appearing.     Comments: In acute distress secondary to right-sided hip joint pain  HENT:     Mouth/Throat:     Mouth: Mucous membranes are moist.  Cardiovascular:     Rate and Rhythm: Normal rate and regular rhythm.     Pulses: Normal pulses.     Heart sounds: Normal heart sounds.  Pulmonary:     Effort: Pulmonary effort is normal.     Breath sounds: Normal breath sounds.  Abdominal:     Palpations: Abdomen is soft.  Musculoskeletal:        General: Tenderness present. No signs of injury.     Cervical back: Neck supple.     Right lower leg: No edema.     Left lower leg: No edema.  Skin:    Capillary Refill: Capillary refill takes less than 2 seconds.  Neurological:     Mental Status: She is alert and oriented to person, place, and time.  Psychiatric:        Mood and Affect: Mood normal.        Thought Content: Thought content normal.      Labs on Admission:  I have personally reviewed following labs and imaging studies  CBC: Recent Labs  Lab 02/09/24 0341 02/09/24 0356  WBC 10.0  --   NEUTROABS 8.4*  --   HGB 12.5 12.2  HCT 37.3 36.0  MCV 91.4  --   PLT 228  --    Basic Metabolic Panel: Recent Labs  Lab 02/09/24 0356  NA 142  K 3.8  CL 107  GLUCOSE 100*  BUN 24*  CREATININE 1.00   GFR: CrCl cannot be calculated (  Unknown ideal weight.). Liver Function Tests: No results for input(s): "AST", "ALT", "ALKPHOS", "BILITOT", "PROT", "ALBUMIN" in the last 168 hours. No results for input(s): "LIPASE", "AMYLASE" in the last 168 hours. No results for input(s): "AMMONIA" in the last 168 hours. Coagulation Profile: No results for input(s): "INR", "PROTIME" in the last 168 hours. Cardiac Enzymes: No results for input(s): "CKTOTAL", "CKMB", "CKMBINDEX", "TROPONINI", "TROPONINIHS" in the last 168 hours. BNP (last 3 results) No results for input(s): "BNP" in the last 8760 hours. HbA1C: No results for input(s): "HGBA1C" in the last 72 hours. CBG: No results for input(s): "GLUCAP" in the last 168 hours. Lipid Profile: No results for input(s): "CHOL", "HDL", "LDLCALC", "TRIG", "CHOLHDL", "LDLDIRECT" in the last 72 hours. Thyroid  Function Tests: No results for input(s): "TSH", "T4TOTAL", "FREET4", "T3FREE", "THYROIDAB" in the last 72 hours. Anemia Panel: No results for input(s): "VITAMINB12", "FOLATE", "FERRITIN", "TIBC", "IRON", "RETICCTPCT" in the last 72 hours. Urine analysis:    Component Value Date/Time   COLORURINE YELLOW 03/06/2022 2131   APPEARANCEUR HAZY (A) 03/06/2022 2131   LABSPEC >1.046 (H) 03/06/2022 2131   PHURINE 5.0 03/06/2022 2131   GLUCOSEU NEGATIVE 03/06/2022 2131   HGBUR NEGATIVE 03/06/2022 2131   BILIRUBINUR NEGATIVE 03/06/2022 2131   KETONESUR NEGATIVE 03/06/2022 2131   PROTEINUR NEGATIVE 03/06/2022 2131   NITRITE NEGATIVE 03/06/2022 2131   LEUKOCYTESUR LARGE (A) 03/06/2022 2131    Radiological Exams on  Admission: I have personally reviewed images CT PELVIS WO CONTRAST Result Date: 02/09/2024 CLINICAL DATA:  Pelvic fracture. Fell onto right side when putting on socks. EXAM: CT PELVIS WITHOUT CONTRAST TECHNIQUE: Multidetector CT imaging of the pelvis was performed following the standard protocol without intravenous contrast. RADIATION DOSE REDUCTION: This exam was performed according to the departmental dose-optimization program which includes automated exposure control, adjustment of the mA and/or kV according to patient size and/or use of iterative reconstruction technique. COMPARISON:  CT abdomen pelvis 03/06/2022 FINDINGS: Urinary Tract:  No abnormality visualized. Bowel:  Unremarkable visualized pelvic bowel loops. Vascular/Lymphatic: No pathologically enlarged lymph nodes. No significant vascular abnormality seen. Reproductive:  No mass or other significant abnormality Other:  None. Musculoskeletal: Acute mildly displaced mildly impacted subcapital femoral neck fracture of the right femur. The right femoral shaft is displaced superiorly. No dislocation. IMPRESSION: Acute displaced impacted subcapital femoral neck fracture of the right femur. Electronically Signed   By: Rozell Cornet M.D.   On: 02/09/2024 03:46   CT Head Wo Contrast Result Date: 02/09/2024 CLINICAL DATA:  Polytrauma, blunt EXAM: CT HEAD WITHOUT CONTRAST CT CERVICAL SPINE WITHOUT CONTRAST TECHNIQUE: Multidetector CT imaging of the head and cervical spine was performed following the standard protocol without intravenous contrast. Multiplanar CT image reconstructions of the cervical spine were also generated. RADIATION DOSE REDUCTION: This exam was performed according to the departmental dose-optimization program which includes automated exposure control, adjustment of the mA and/or kV according to patient size and/or use of iterative reconstruction technique. COMPARISON:  None Available. FINDINGS: CT HEAD FINDINGS Brain: No evidence of acute  infarction, hemorrhage, hydrocephalus, extra-axial collection or mass lesion/mass effect. Vascular: No hyperdense vessel identified. Skull: No acute fracture. Sinuses/Orbits: No acute finding. Other: No mastoid effusions. CT CERVICAL SPINE FINDINGS Alignment: Reversal of the normal cervical lordosis. No substantial sagittal subluxation. Skull base and vertebrae: No acute fracture. Soft tissues and spinal canal: No prevertebral fluid or swelling. No visible canal hematoma. Disc levels: Degenerative disc disease, greatest on the right at C4-C5 where there is disc height loss and endplate spurring. Upper chest:  Visualized lung apices are clear. IMPRESSION: 1. No evidence of acute intracranial abnormality. 2. No evidence of acute fracture or traumatic malalignment in the cervical spine. Electronically Signed   By: Stevenson Elbe M.D.   On: 02/09/2024 03:45   CT Cervical Spine Wo Contrast Result Date: 02/09/2024 CLINICAL DATA:  Polytrauma, blunt EXAM: CT HEAD WITHOUT CONTRAST CT CERVICAL SPINE WITHOUT CONTRAST TECHNIQUE: Multidetector CT imaging of the head and cervical spine was performed following the standard protocol without intravenous contrast. Multiplanar CT image reconstructions of the cervical spine were also generated. RADIATION DOSE REDUCTION: This exam was performed according to the departmental dose-optimization program which includes automated exposure control, adjustment of the mA and/or kV according to patient size and/or use of iterative reconstruction technique. COMPARISON:  None Available. FINDINGS: CT HEAD FINDINGS Brain: No evidence of acute infarction, hemorrhage, hydrocephalus, extra-axial collection or mass lesion/mass effect. Vascular: No hyperdense vessel identified. Skull: No acute fracture. Sinuses/Orbits: No acute finding. Other: No mastoid effusions. CT CERVICAL SPINE FINDINGS Alignment: Reversal of the normal cervical lordosis. No substantial sagittal subluxation. Skull base and  vertebrae: No acute fracture. Soft tissues and spinal canal: No prevertebral fluid or swelling. No visible canal hematoma. Disc levels: Degenerative disc disease, greatest on the right at C4-C5 where there is disc height loss and endplate spurring. Upper chest: Visualized lung apices are clear. IMPRESSION: 1. No evidence of acute intracranial abnormality. 2. No evidence of acute fracture or traumatic malalignment in the cervical spine. Electronically Signed   By: Stevenson Elbe M.D.   On: 02/09/2024 03:45   CT Lumbar Spine Wo Contrast Result Date: 02/09/2024 CLINICAL DATA:  Marvell Slider onto right side. Right hip pain and elbow pain. EXAM: CT LUMBAR SPINE WITHOUT CONTRAST TECHNIQUE: Multidetector CT imaging of the lumbar spine was performed without intravenous contrast administration. Multiplanar CT image reconstructions were also generated. RADIATION DOSE REDUCTION: This exam was performed according to the departmental dose-optimization program which includes automated exposure control, adjustment of the mA and/or kV according to patient size and/or use of iterative reconstruction technique. COMPARISON:  CT abdomen pelvis 03/06/2022. FINDINGS: Segmentation: 5 lumbar type vertebrae. Alignment: No evidence of traumatic listhesis. Vertebrae: No acute fracture. Paraspinal and other soft tissues: Negative. Disc levels: Multilevel age commensurate spondylosis, disc space height loss, degenerative endplate changes greatest at L2-L3 and L3-L4. Moderate lower lumbar facet arthropathy. IMPRESSION: No evidence of acute fracture or traumatic listhesis. Electronically Signed   By: Rozell Cornet M.D.   On: 02/09/2024 03:44     EKG: Pending EKG   Assessment/Plan: Principal Problem:   Closed displaced fracture of right femoral neck (HCC) Active Problems:   Fall at home, initial encounter   Hypothyroidism   Mild cognitive impairment   Essential hypertension   History of depression    Assessment and Plan: Acute  displaced right femoral neck fracture Mechanical fall -Patient has been present emergency department complaining of mechanical fall.  In the ED patient found hypertensive otherwise hemodynamically stable.  CBC and CMP grossly unremarkable - CT head, CT cervical spine and CT lumbar spine unremarkable. - CT pelvis showed acute displaced impacted subcapital femoral neck fracture of the right femur. -In the ED patient has been treated with fentanyl and morphine which improved the pain control. - Orthopedic surgeon Dr. Charol Copas has been consulted by ED physician Dr. Maralee Senate.  Orthopedic surgery recommended keep patient n.p.o. and will evaluate in the a.m. -Continue fall precaution, pain control with oxycodone and Dilaudid  as needed. -Continue maintenance fluid NS 75 cc/h.  Hypothyroidism -Continue levothyroxine 50 mcg TTS schedule and 75 mcg MWF schedule  Essential hypertension -History of hypertension however currently at home patient is not any blood pressure regimen.  Currently blood pressure is slightly elevated in the setting of pain response.  Continue to manage pain and if blood pressure is still remains elevated will start hydralazine as needed.  Mild cognitive impairment -Continue memantine  twice daily  History of depression -Continue Zoloft  and Atarax  as needed  Hyperlipidemia - Canrestart Lipitor postsurgery   DVT prophylaxis:  SQ Heparin and SCD Code Status:  Full Code Diet: Currently n.p.o. for anticipated orthopedics intervention Family Communication: No family member at bedside Disposition Plan: Pending orthopedics formal evaluation and recommendation.  Continue pain control as of now Consults: Orthopedic surgery Admission status:   Inpatient, Telemetry bed  Severity of Illness: The appropriate patient status for this patient is INPATIENT. Inpatient status is judged to be reasonable and necessary in order to provide the required intensity of service to ensure the  patient's safety. The patient's presenting symptoms, physical exam findings, and initial radiographic and laboratory data in the context of their chronic comorbidities is felt to place them at high risk for further clinical deterioration. Furthermore, it is not anticipated that the patient will be medically stable for discharge from the hospital within 2 midnights of admission.   * I certify that at the point of admission it is my clinical judgment that the patient will require inpatient hospital care spanning beyond 2 midnights from the point of admission due to high intensity of service, high risk for further deterioration and high frequency of surveillance required.Aaron Aas    Chuck Caban, MD [facility]  How to contact the [facility] Attending or Consulting provider 7A - 7P or covering provider during after hours 7P -7A, for this patient.  Check the care team in [facility] and look for a) attending/consulting [facility] provider listed and b) the [facility] team listed Log into [email or web address] and use [place]'s universal password to access. If you do not have the password, please contact the hospital operator. Locate the [facility] provider you are looking for under [facility] and page to a number that you can be directly reached. If you still have difficulty reaching the provider, please page the [facility] (Director on Call) for the Hospitalists listed on amion for assistance.  02/09/2024, 5:12 AM

## 2024-02-09 NOTE — Plan of Care (Signed)
   Problem: Education: Goal: Knowledge of General Education information will improve Description: Including pain rating scale, medication(s)/side effects and non-pharmacologic comfort measures Outcome: Progressing   Problem: Activity: Goal: Risk for activity intolerance will decrease Outcome: Progressing   Problem: Coping: Goal: Level of anxiety will decrease Outcome: Progressing

## 2024-02-09 NOTE — ED Provider Notes (Signed)
 [place] EMERGENCY DEPARTMENT AT [facility] Provider Note   CSN: [id number] Arrival date & time: 02/09/24  4098     History  Chief Complaint  Patient presents with   [facility] Tasha Scott is a 88 y.o. female.  The history is provided by the patient.  Fall This is a new problem. The current episode started less than 1 hour ago. The problem occurs rarely. The problem has been resolved. Pertinent negatives include no chest pain, no abdominal pain, no headaches and no shortness of breath. Nothing aggravates the symptoms. Nothing relieves the symptoms. She has tried nothing for the symptoms. The treatment provided no relief.  Patient with arthritis who slipped and fell in stockinged feet presents with right groin pain.      Past Medical History:  Diagnosis Date   Acid reflux    Arthritis    Thyroid  disease      Home Medications Prior to Admission medications   Medication Sig Start Date End Date Taking? Authorizing Provider  mirtazapine (REMERON) 15 MG tablet Take 15 mg by mouth at bedtime. 02/06/24  Yes [provider]  atorvastatin  (LIPITOR) 20 MG tablet Take 1 tablet (20 mg total) by mouth daily. 12/13/23  Yes Aida House, MD  b complex vitamins capsule Take 1 capsule by mouth daily.   Yes [provider]  Cholecalciferol (VITAMIN D3) 125 MCG (5000 UT) CAPS Take 1 capsule by mouth daily.   Yes [provider]  Diclofenac Sodium CR 100 MG 24 hr tablet Take 100 mg by mouth daily as needed for pain.   Yes [provider]  memantine  (NAMENDA ) 5 MG tablet Take 1 tablet (5 mg total) by mouth daily for 30 days, THEN 1 tablet (5 mg total) 2 (two) times daily. 11/28/23 01/27/24  Aida House, MD  Multiple Vitamins-Minerals (WOMENS MULTIVITAMIN) TABS Take 1 tablet by mouth daily.   Yes [provider]  Nutritional Supplements (JUICE PLUS FIBRE PO) Take 2 capsules by mouth in the morning and at bedtime.   Yes [provider]  Probiotic Product (PROBIOTIC-10 PO) Take 1 capsule by mouth daily.   Yes [provider]  sertraline  (ZOLOFT ) 100 MG tablet Take 1.5 tablets (150 mg total) by mouth daily. 12/13/23  Yes Aida House, MD  SYNTHROID  50 MCG tablet Take 1 tablet (50 mcg total) by mouth every Tuesday, Thursday, Saturday, and Sunday. 11/29/23  Yes Aida House, MD  SYNTHROID  75 MCG tablet Take 1 tablet (75 mcg total) by mouth every Monday, Wednesday, and Friday. 11/28/23  Yes Aida House, MD      Allergies    Codeine    Review of Systems   Review of Systems  Constitutional:  Negative for fever.  Respiratory:  Negative for shortness of breath.   Cardiovascular:  Negative for chest pain.  Gastrointestinal:  Negative for abdominal pain.  Musculoskeletal:  Positive for arthralgias.  Neurological:  Negative for headaches.  All other systems reviewed and are negative.   Physical Exam Updated Vital Signs BP (!) 168/73 (BP Location: Left Arm)   Pulse 64   Temp (!) 97.5 F (36.4 C) (Oral)   Resp 17   SpO2 100%  Physical Exam Vitals and nursing note reviewed.  Constitutional:      General: She is not in acute distress.    Appearance: She is well-developed.  HENT:     Head: Normocephalic and atraumatic.     Nose: Nose  normal.  Eyes:     Pupils: Pupils are equal, round, and reactive to light.  Cardiovascular:     Rate and Rhythm: Normal rate and regular rhythm.     Pulses: Normal pulses.     Heart sounds: Normal heart sounds.  Pulmonary:     Effort: Pulmonary effort is normal. No respiratory distress.     Breath sounds: Normal breath sounds.  Abdominal:     General: Bowel sounds are normal. There is no distension.     Palpations: Abdomen is soft.     Tenderness: There is no abdominal tenderness. There is no guarding or rebound.  Musculoskeletal:        General: Tenderness present. Normal range of motion.     Cervical back: Neck supple.     Right hip: Bony  tenderness present.     Right knee: Normal.     Left knee: Normal.     Right ankle: Normal.     Right Achilles Tendon: Normal.     Left ankle: Normal.     Left Achilles Tendon: Normal.     Right foot: Normal.     Left foot: Normal.  Skin:    General: Skin is warm and dry.     Capillary Refill: Capillary refill takes less than 2 seconds.     Findings: No erythema or rash.  Neurological:     General: No focal deficit present.     Deep Tendon Reflexes: Reflexes normal.  Psychiatric:        Mood and Affect: Mood normal.     ED Results / Procedures / Treatments   Labs (all labs ordered are listed, but only abnormal results are displayed) Results for orders placed or performed during the hospital encounter of 02/09/24  CBC with Differential   Collection Time: 02/09/24  3:41 AM  Result Value Ref Range   WBC 10.0 4.0 - 10.5 K/uL   RBC 4.08 3.87 - 5.11 MIL/uL   Hemoglobin 12.5 12.0 - 15.0 g/dL   HCT 14.7 82.9 - 56.2 %   MCV 91.4 80.0 - 100.0 fL   MCH 30.6 26.0 - 34.0 pg   MCHC 33.5 30.0 - 36.0 g/dL   RDW 13.0 86.5 - 78.4 %   Platelets 228 150 - 400 K/uL   nRBC 0.0 0.0 - 0.2 %   Neutrophils Relative % 85 %   Neutro Abs 8.4 (H) 1.7 - 7.7 K/uL   Lymphocytes Relative 10 %   Lymphs Abs 1.0 0.7 - 4.0 K/uL   Monocytes Relative 4 %   Monocytes Absolute 0.4 0.1 - 1.0 K/uL   Eosinophils Relative 0 %   Eosinophils Absolute 0.0 0.0 - 0.5 K/uL   Basophils Relative 0 %   Basophils Absolute 0.0 0.0 - 0.1 K/uL   Immature Granulocytes 1 %   Abs Immature Granulocytes 0.05 0.00 - 0.07 K/uL  I-stat chem 8, ED (not at [facility], [facility] or [facility])   Collection Time: 02/09/24  3:56 AM  Result Value Ref Range   Sodium 142 135 - 145 mmol/L   Potassium 3.8 3.5 - 5.1 mmol/L   Chloride 107 98 - 111 mmol/L   BUN 24 (H) 8 - 23 mg/dL   Creatinine, Ser 6.96 0.44 - 1.00 mg/dL   Glucose, Bld 295 (H) 70 - 99 mg/dL   Calcium , Ion 1.17 1.15 - 1.40 mmol/L   TCO2 23 22 - 32 mmol/L   Hemoglobin 12.2 12.0 - 15.0  g/dL   HCT 28.4 13.2 -  46.0 %   CT PELVIS WO CONTRAST Result Date: 02/09/2024 CLINICAL DATA:  Pelvic fracture. Fell onto right side when putting on socks. EXAM: CT PELVIS WITHOUT CONTRAST TECHNIQUE: Multidetector CT imaging of the pelvis was performed following the standard protocol without intravenous contrast. RADIATION DOSE REDUCTION: This exam was performed according to the departmental dose-optimization program which includes automated exposure control, adjustment of the mA and/or kV according to patient size and/or use of iterative reconstruction technique. COMPARISON:  CT abdomen pelvis 03/06/2022 FINDINGS: Urinary Tract:  No abnormality visualized. Bowel:  Unremarkable visualized pelvic bowel loops. Vascular/Lymphatic: No pathologically enlarged lymph nodes. No significant vascular abnormality seen. Reproductive:  No mass or other significant abnormality Other:  None. Musculoskeletal: Acute mildly displaced mildly impacted subcapital femoral neck fracture of the right femur. The right femoral shaft is displaced superiorly. No dislocation. IMPRESSION: Acute displaced impacted subcapital femoral neck fracture of the right femur. Electronically Signed   By: Rozell Cornet M.D.   On: 02/09/2024 03:46   CT Head Wo Contrast Result Date: 02/09/2024 CLINICAL DATA:  Polytrauma, blunt EXAM: CT HEAD WITHOUT CONTRAST CT CERVICAL SPINE WITHOUT CONTRAST TECHNIQUE: Multidetector CT imaging of the head and cervical spine was performed following the standard protocol without intravenous contrast. Multiplanar CT image reconstructions of the cervical spine were also generated. RADIATION DOSE REDUCTION: This exam was performed according to the departmental dose-optimization program which includes automated exposure control, adjustment of the mA and/or kV according to patient size and/or use of iterative reconstruction technique. COMPARISON:  None Available. FINDINGS: CT HEAD FINDINGS Brain: No evidence of acute  infarction, hemorrhage, hydrocephalus, extra-axial collection or mass lesion/mass effect. Vascular: No hyperdense vessel identified. Skull: No acute fracture. Sinuses/Orbits: No acute finding. Other: No mastoid effusions. CT CERVICAL SPINE FINDINGS Alignment: Reversal of the normal cervical lordosis. No substantial sagittal subluxation. Skull base and vertebrae: No acute fracture. Soft tissues and spinal canal: No prevertebral fluid or swelling. No visible canal hematoma. Disc levels: Degenerative disc disease, greatest on the right at C4-C5 where there is disc height loss and endplate spurring. Upper chest: Visualized lung apices are clear. IMPRESSION: 1. No evidence of acute intracranial abnormality. 2. No evidence of acute fracture or traumatic malalignment in the cervical spine. Electronically Signed   By: Stevenson Elbe M.D.   On: 02/09/2024 03:45   CT Cervical Spine Wo Contrast Result Date: 02/09/2024 CLINICAL DATA:  Polytrauma, blunt EXAM: CT HEAD WITHOUT CONTRAST CT CERVICAL SPINE WITHOUT CONTRAST TECHNIQUE: Multidetector CT imaging of the head and cervical spine was performed following the standard protocol without intravenous contrast. Multiplanar CT image reconstructions of the cervical spine were also generated. RADIATION DOSE REDUCTION: This exam was performed according to the departmental dose-optimization program which includes automated exposure control, adjustment of the mA and/or kV according to patient size and/or use of iterative reconstruction technique. COMPARISON:  None Available. FINDINGS: CT HEAD FINDINGS Brain: No evidence of acute infarction, hemorrhage, hydrocephalus, extra-axial collection or mass lesion/mass effect. Vascular: No hyperdense vessel identified. Skull: No acute fracture. Sinuses/Orbits: No acute finding. Other: No mastoid effusions. CT CERVICAL SPINE FINDINGS Alignment: Reversal of the normal cervical lordosis. No substantial sagittal subluxation. Skull base and  vertebrae: No acute fracture. Soft tissues and spinal canal: No prevertebral fluid or swelling. No visible canal hematoma. Disc levels: Degenerative disc disease, greatest on the right at C4-C5 where there is disc height loss and endplate spurring. Upper chest: Visualized lung apices are clear. IMPRESSION: 1. No evidence of acute intracranial abnormality. 2. No evidence of  acute fracture or traumatic malalignment in the cervical spine. Electronically Signed   By: Stevenson Elbe M.D.   On: 02/09/2024 03:45   CT Lumbar Spine Wo Contrast Result Date: 02/09/2024 CLINICAL DATA:  Marvell Slider onto right side. Right hip pain and elbow pain. EXAM: CT LUMBAR SPINE WITHOUT CONTRAST TECHNIQUE: Multidetector CT imaging of the lumbar spine was performed without intravenous contrast administration. Multiplanar CT image reconstructions were also generated. RADIATION DOSE REDUCTION: This exam was performed according to the departmental dose-optimization program which includes automated exposure control, adjustment of the mA and/or kV according to patient size and/or use of iterative reconstruction technique. COMPARISON:  CT abdomen pelvis 03/06/2022. FINDINGS: Segmentation: 5 lumbar type vertebrae. Alignment: No evidence of traumatic listhesis. Vertebrae: No acute fracture. Paraspinal and other soft tissues: Negative. Disc levels: Multilevel age commensurate spondylosis, disc space height loss, degenerative endplate changes greatest at L2-L3 and L3-L4. Moderate lower lumbar facet arthropathy. IMPRESSION: No evidence of acute fracture or traumatic listhesis. Electronically Signed   By: Rozell Cornet M.D.   On: 02/09/2024 03:44     Radiology CT PELVIS WO CONTRAST Result Date: 02/09/2024 CLINICAL DATA:  Pelvic fracture. Fell onto right side when putting on socks. EXAM: CT PELVIS WITHOUT CONTRAST TECHNIQUE: Multidetector CT imaging of the pelvis was performed following the standard protocol without intravenous contrast. RADIATION  DOSE REDUCTION: This exam was performed according to the departmental dose-optimization program which includes automated exposure control, adjustment of the mA and/or kV according to patient size and/or use of iterative reconstruction technique. COMPARISON:  CT abdomen pelvis 03/06/2022 FINDINGS: Urinary Tract:  No abnormality visualized. Bowel:  Unremarkable visualized pelvic bowel loops. Vascular/Lymphatic: No pathologically enlarged lymph nodes. No significant vascular abnormality seen. Reproductive:  No mass or other significant abnormality Other:  None. Musculoskeletal: Acute mildly displaced mildly impacted subcapital femoral neck fracture of the right femur. The right femoral shaft is displaced superiorly. No dislocation. IMPRESSION: Acute displaced impacted subcapital femoral neck fracture of the right femur. Electronically Signed   By: Rozell Cornet M.D.   On: 02/09/2024 03:46   CT Head Wo Contrast Result Date: 02/09/2024 CLINICAL DATA:  Polytrauma, blunt EXAM: CT HEAD WITHOUT CONTRAST CT CERVICAL SPINE WITHOUT CONTRAST TECHNIQUE: Multidetector CT imaging of the head and cervical spine was performed following the standard protocol without intravenous contrast. Multiplanar CT image reconstructions of the cervical spine were also generated. RADIATION DOSE REDUCTION: This exam was performed according to the departmental dose-optimization program which includes automated exposure control, adjustment of the mA and/or kV according to patient size and/or use of iterative reconstruction technique. COMPARISON:  None Available. FINDINGS: CT HEAD FINDINGS Brain: No evidence of acute infarction, hemorrhage, hydrocephalus, extra-axial collection or mass lesion/mass effect. Vascular: No hyperdense vessel identified. Skull: No acute fracture. Sinuses/Orbits: No acute finding. Other: No mastoid effusions. CT CERVICAL SPINE FINDINGS Alignment: Reversal of the normal cervical lordosis. No substantial sagittal  subluxation. Skull base and vertebrae: No acute fracture. Soft tissues and spinal canal: No prevertebral fluid or swelling. No visible canal hematoma. Disc levels: Degenerative disc disease, greatest on the right at C4-C5 where there is disc height loss and endplate spurring. Upper chest: Visualized lung apices are clear. IMPRESSION: 1. No evidence of acute intracranial abnormality. 2. No evidence of acute fracture or traumatic malalignment in the cervical spine. Electronically Signed   By: Stevenson Elbe M.D.   On: 02/09/2024 03:45   CT Cervical Spine Wo Contrast Result Date: 02/09/2024 CLINICAL DATA:  Polytrauma, blunt EXAM: CT HEAD WITHOUT CONTRAST CT  CERVICAL SPINE WITHOUT CONTRAST TECHNIQUE: Multidetector CT imaging of the head and cervical spine was performed following the standard protocol without intravenous contrast. Multiplanar CT image reconstructions of the cervical spine were also generated. RADIATION DOSE REDUCTION: This exam was performed according to the departmental dose-optimization program which includes automated exposure control, adjustment of the mA and/or kV according to patient size and/or use of iterative reconstruction technique. COMPARISON:  None Available. FINDINGS: CT HEAD FINDINGS Brain: No evidence of acute infarction, hemorrhage, hydrocephalus, extra-axial collection or mass lesion/mass effect. Vascular: No hyperdense vessel identified. Skull: No acute fracture. Sinuses/Orbits: No acute finding. Other: No mastoid effusions. CT CERVICAL SPINE FINDINGS Alignment: Reversal of the normal cervical lordosis. No substantial sagittal subluxation. Skull base and vertebrae: No acute fracture. Soft tissues and spinal canal: No prevertebral fluid or swelling. No visible canal hematoma. Disc levels: Degenerative disc disease, greatest on the right at C4-C5 where there is disc height loss and endplate spurring. Upper chest: Visualized lung apices are clear. IMPRESSION: 1. No evidence of acute  intracranial abnormality. 2. No evidence of acute fracture or traumatic malalignment in the cervical spine. Electronically Signed   By: Stevenson Elbe M.D.   On: 02/09/2024 03:45   CT Lumbar Spine Wo Contrast Result Date: 02/09/2024 CLINICAL DATA:  Marvell Slider onto right side. Right hip pain and elbow pain. EXAM: CT LUMBAR SPINE WITHOUT CONTRAST TECHNIQUE: Multidetector CT imaging of the lumbar spine was performed without intravenous contrast administration. Multiplanar CT image reconstructions were also generated. RADIATION DOSE REDUCTION: This exam was performed according to the departmental dose-optimization program which includes automated exposure control, adjustment of the mA and/or kV according to patient size and/or use of iterative reconstruction technique. COMPARISON:  CT abdomen pelvis 03/06/2022. FINDINGS: Segmentation: 5 lumbar type vertebrae. Alignment: No evidence of traumatic listhesis. Vertebrae: No acute fracture. Paraspinal and other soft tissues: Negative. Disc levels: Multilevel age commensurate spondylosis, disc space height loss, degenerative endplate changes greatest at L2-L3 and L3-L4. Moderate lower lumbar facet arthropathy. IMPRESSION: No evidence of acute fracture or traumatic listhesis. Electronically Signed   By: Rozell Cornet M.D.   On: 02/09/2024 03:44    Procedures Procedures    Medications Ordered in ED Medications  0.9 %  sodium chloride  infusion (has no administration in time range)  atorvastatin  (LIPITOR) tablet 20 mg (has no administration in time range)  hydrOXYzine  (VISTARIL ) capsule 25 mg (has no administration in time range)  memantine  (NAMENDA ) tablet 5 mg (has no administration in time range)  sertraline  (ZOLOFT ) tablet 150 mg (has no administration in time range)  levothyroxine (SYNTHROID ) tablet 50 mcg (has no administration in time range)  levothyroxine (SYNTHROID ) tablet 75 mcg (has no administration in time range)  heparin injection 5,000 Units (has no  administration in time range)  sodium chloride  flush (NS) 0.9 % injection 3 mL (has no administration in time range)  sodium chloride  flush (NS) 0.9 % injection 3 mL (has no administration in time range)  sodium chloride  flush (NS) 0.9 % injection 3 mL (has no administration in time range)  0.9 %  sodium chloride  infusion (has no administration in time range)  acetaminophen (TYLENOL) tablet 650 mg (has no administration in time range)    Or  acetaminophen (TYLENOL) suppository 650 mg (has no administration in time range)  fentaNYL (SUBLIMAZE) injection 12.5-50 mcg (has no administration in time range)  oxyCODONE (Oxy IR/ROXICODONE) immediate release tablet 5 mg (has no administration in time range)  ondansetron (ZOFRAN) tablet 4 mg (has no administration in  time range)    Or  ondansetron (ZOFRAN) injection 4 mg (has no administration in time range)  methocarbamol (ROBAXIN) injection 500 mg (has no administration in time range)  fentaNYL (SUBLIMAZE) injection 50 mcg (50 mcg Intravenous Given 02/09/24 0344)  morphine (PF) 4 MG/ML injection 4 mg (4 mg Intravenous Given 02/09/24 0428)    ED Course/ Medical Decision Making/ A&P                                 Medical Decision Making Patient with fall at home face forward   Amount and/or Complexity of Data Reviewed Independent Historian: EMS    Details: See above  External Data Reviewed: notes.    Details: Previous notes reviewed  Labs: ordered.    Details: Normal white count 10, normal hemoglobin 12.5, platelets normal.  Normal sodium 142, normal potassium normal creatinine  Radiology: ordered and independent interpretation performed.    Details: Hip fracture right by me  Discussion of management or test interpretation with external provider(s): Case d/w Dr. Hiram Lukes on call for Dr. Carlye Child, Dr. Carlye Child to see in am   Risk Prescription drug management. Decision regarding hospitalization.    Final Clinical Impression(s) / ED  Diagnoses Final diagnoses:  Fall, initial encounter  Closed fracture of right hip, initial encounter [facility])   The patient appears reasonably stabilized for admission considering the current resources, flow, and capabilities available in the ED at this time, and I doubt any other [facility] requiring further screening and/or treatment in the ED prior to admission.  Rx / DC Orders ED Discharge Orders     None         Melysa Schroyer, MD 02/09/24 [phone]

## 2024-02-09 NOTE — Op Note (Signed)
 OPERATIVE REPORT  SURGEON: Adonica Hoose, MD   ASSISTANT: Trixie Furnace, PA-C  PREOPERATIVE DIAGNOSIS: Displaced Right femoral neck fracture.   POSTOPERATIVE DIAGNOSIS: Displaced Right femoral neck fracture.   PROCEDURE: Right total hip arthroplasty, anterior approach.   IMPLANTS: Biomet Taperloc Reduced Distal stem, size 10 x 140 mm, high offset. Biomet G7 OsseoTi Cup, size 52 mm. Biomet Vivacit-E liner, size 36 mm, E, +5 mm neutral. Biomet metal head ball, size 36 - 6 mm.  ANESTHESIA:  MAC and Spinal  ANTIBIOTICS: 2g ancef.  ESTIMATED BLOOD LOSS:-200 mL    DRAINS: None.  COMPLICATIONS: None   CONDITION: PACU - hemodynamically stable.   BRIEF CLINICAL NOTE: Tasha Scott is a 88 y.o. female with a displaced Right femoral neck fracture. The patient was admitted to the hospitalist service and underwent perioperative risk stratification and medical optimization. The risks, benefits, and alternatives to total hip arthroplasty were explained, and the patient elected to proceed.  PROCEDURE IN DETAIL: The patient was taken to the operating room and general anesthesia was induced on the hospital bed.  The patient was then positioned on the Hana table.  All bony prominences were well padded.  The hip was prepped and draped in the normal sterile surgical fashion.  A time-out was called verifying side and site of surgery. Antibiotics were given within 60 minutes of beginning the procedure.   Bikini incision was made, and the direct anterior approach to the hip was performed through the Hueter interval.  Lateral femoral circumflex vessels were treated with the Auqumantys. The anterior capsule was exposed and an inverted T capsulotomy was made.  Fracture hematoma was encountered and evacuated. The patient was found to have a comminuted Right subcapital femoral neck fracture.  I freshened the femoral neck cut with a saw.  I removed the femoral neck fragment.  A corkscrew was placed into the head  and the head was removed.  This was passed to the back table and was measured. The pubofemoral ligament was released subperiosteally to the lesser trochanter.  Acetabular exposure was achieved, and the pulvinar and labrum were excised. Sequential reaming of the acetabulum was then performed up to a size 51 mm reamer under direct visulization. A 52 mm cup was then opened and impacted into place at approximately 40 degrees of abduction and 20 degrees of anteversion. The final polyethylene liner was impacted into place and acetabular osteophytes were removed.    I then gained femoral exposure taking care to protect the abductors and greater trochanter.  This was performed using standard external rotation, extension, and adduction.  A cookie cutter was used to enter the femoral canal, and then the femoral canal finder was placed.  Sequential broaching was performed up to a size 10.  Calcar planer was used on the femoral neck remnant.  I placed a high offset neck and a trial head ball.  The hip was reduced.  Leg lengths and offset were checked fluoroscopically.  The hip was dislocated and trial components were removed.  The final implants were placed, and the hip was reduced.  Fluoroscopy was used to confirm component position and leg lengths.  At 90 degrees of external rotation and full extension, the hip was stable to an anterior directed force.   The wound was copiously irrigated with Irrisept solution and normal saline using pulse lavage.  Marcaine solution was injected into the periarticular soft tissue.  The wound was closed in layers using #1 Stratafix for the fascia, 2-0 Vicryl for the  subcutaneous fat, 2-0 Monocryl for the deep dermal layer, 3-0 running Monocryl subcuticular stitch, and Dermabond for the skin.  Once the glue was fully dried, an Aquacell Ag dressing was applied.  The patient was transported to the recovery room in stable condition.  Sponge, needle, and instrument counts were correct at the  end of the case x2.  The patient tolerated the procedure well and there were no known complications.  Please note that a surgical assistant was a medical necessity for this procedure to perform it in a safe and expeditious manner. Assistant was necessary to provide appropriate retraction of vital neurovascular structures, to prevent femoral fracture, and to allow for anatomic placement of the prosthesis.

## 2024-02-09 NOTE — Consult Note (Addendum)
 ORTHOPAEDIC CONSULTATION  REQUESTING PHYSICIAN: Magdalene School, MD  PCP:  Aida House, MD  Chief Complaint: Left hip pain, fall.   HPI: Tasha Scott is a 88 y.o. female with medical history significant of prediabetes, hyperlipidemia, hypothyroidism, mild cognitive impairment and depressive disorder presented to the emergency department complaining of a mechanical fall after slipping on her socks landed on the right sided hip.  Since the fall patient is complaining about right-sided hip pain and inability to ambulate. Denies any head injury and loss of consciousness. Patient is complaining about significant right-sided hip joint pain.  There is no other complaint at this time. X-rays showed right femoral neck fracture. Orthopedics consulted for management.   Patient denies use of any blood thinners. She lives at home alone, and does her own grocery shopping. She does not normally use any assistive devices. She denies any heart/lung history. She does report CKD history.    Past Medical History:  Diagnosis Date   Acid reflux    Arthritis    Thyroid  disease    Past Surgical History:  Procedure Laterality Date   bladder tack     2007   KNEE SURGERY Right    left   Social History   Socioeconomic History   Marital status: Married    Spouse name: Not on file   Number of children: Not on file   Years of education: Not on file   Highest education level: Not on file  Occupational History   Not on file  Tobacco Use   Smoking status: Never   Smokeless tobacco: Not on file  Substance and Sexual Activity   Alcohol use: Yes   Drug use: No   Sexual activity: Not on file  Other Topics Concern   Not on file  Social History Narrative   Not on file   Social Drivers of Health   Financial Resource Strain: Not on file  Food Insecurity: No Food Insecurity (02/09/2024)   Hunger Vital Sign    Worried About Running Out of Food in the Last Year: Never true    Ran Out of Food  in the Last Year: Never true  Transportation Needs: No Transportation Needs (02/09/2024)   PRAPARE - Administrator, Civil Service (Medical): No    Lack of Transportation (Non-Medical): No  Physical Activity: Not on file  Stress: Not on file  Social Connections: Socially Isolated (02/09/2024)   Social Connection and Isolation Panel [NHANES]    Frequency of Communication with Friends and Family: Never    Frequency of Social Gatherings with Friends and Family: Never    Attends Religious Services: Never    Database administrator or Organizations: No    Attends Banker Meetings: Never    Marital Status: Widowed   History reviewed. No pertinent family history. Allergies  Allergen Reactions   Codeine     Hallucinations, dizziness   Prior to Admission medications   Medication Sig Start Date End Date Taking? Authorizing Provider  atorvastatin  (LIPITOR) 20 MG tablet Take 1 tablet (20 mg total) by mouth daily. 12/13/23  Yes Aida House, MD  b complex vitamins capsule Take 1 capsule by mouth daily.   Yes [provider]  Cholecalciferol (VITAMIN D3) 125 MCG (5000 UT) CAPS Take 1 capsule by mouth daily.   Yes [provider]  Diclofenac Sodium CR 100 MG 24 hr tablet Take 100 mg by mouth daily as needed for pain.   Yes  [provider]  mirtazapine (REMERON) 15 MG tablet Take 15 mg by mouth at bedtime. 02/06/24  Yes [provider]  Multiple Vitamins-Minerals (WOMENS MULTIVITAMIN) TABS Take 1 tablet by mouth daily.   Yes [provider]  Nutritional Supplements (JUICE PLUS FIBRE PO) Take 2 capsules by mouth in the morning and at bedtime.   Yes [provider]  Probiotic Product (PROBIOTIC-10 PO) Take 1 capsule by mouth daily.   Yes [provider]  sertraline  (ZOLOFT ) 100 MG tablet Take 1.5 tablets (150 mg total) by mouth daily. 12/13/23  Yes Aida House, MD  SYNTHROID  50 MCG tablet Take 1 tablet (50 mcg  total) by mouth every Tuesday, Thursday, Saturday, and Sunday. 11/29/23  Yes Aida House, MD  SYNTHROID  75 MCG tablet Take 1 tablet (75 mcg total) by mouth every Monday, Wednesday, and Friday. 11/28/23  Yes Aida House, MD  memantine  (NAMENDA ) 5 MG tablet Take 1 tablet (5 mg total) by mouth daily for 30 days, THEN 1 tablet (5 mg total) 2 (two) times daily. 11/28/23 01/27/24  Aida House, MD   DG HIP UNILAT W OR W/O PELVIS 2-3 VIEWS RIGHT Result Date: 02/09/2024 CLINICAL DATA:  Preop for hip fracture EXAM: DG HIP (WITH OR WITHOUT PELVIS) 3V RIGHT COMPARISON:  CT from earlier today FINDINGS: Subcapital right femoral neck fracture with displacement. Mild degenerative spurring at the right hip. No evidence of pelvic ring fracture. Elongated soft tissue calcification over the left pelvis is in the subcutaneous gluteal fat by CT. IMPRESSION: Displaced right femoral neck fracture. Mild right hip degenerative spurring. Electronically Signed   By: Ronnette Coke M.D.   On: 02/09/2024 06:16   CT PELVIS WO CONTRAST Result Date: 02/09/2024 CLINICAL DATA:  Pelvic fracture. Fell onto right side when putting on socks. EXAM: CT PELVIS WITHOUT CONTRAST TECHNIQUE: Multidetector CT imaging of the pelvis was performed following the standard protocol without intravenous contrast. RADIATION DOSE REDUCTION: This exam was performed according to the departmental dose-optimization program which includes automated exposure control, adjustment of the mA and/or kV according to patient size and/or use of iterative reconstruction technique. COMPARISON:  CT abdomen pelvis 03/06/2022 FINDINGS: Urinary Tract:  No abnormality visualized. Bowel:  Unremarkable visualized pelvic bowel loops. Vascular/Lymphatic: No pathologically enlarged lymph nodes. No significant vascular abnormality seen. Reproductive:  No mass or other significant abnormality Other:  None. Musculoskeletal: Acute mildly displaced mildly impacted subcapital  femoral neck fracture of the right femur. The right femoral shaft is displaced superiorly. No dislocation. IMPRESSION: Acute displaced impacted subcapital femoral neck fracture of the right femur. Electronically Signed   By: Rozell Cornet M.D.   On: 02/09/2024 03:46   CT Head Wo Contrast Result Date: 02/09/2024 CLINICAL DATA:  Polytrauma, blunt EXAM: CT HEAD WITHOUT CONTRAST CT CERVICAL SPINE WITHOUT CONTRAST TECHNIQUE: Multidetector CT imaging of the head and cervical spine was performed following the standard protocol without intravenous contrast. Multiplanar CT image reconstructions of the cervical spine were also generated. RADIATION DOSE REDUCTION: This exam was performed according to the departmental dose-optimization program which includes automated exposure control, adjustment of the mA and/or kV according to patient size and/or use of iterative reconstruction technique. COMPARISON:  None Available. FINDINGS: CT HEAD FINDINGS Brain: No evidence of acute infarction, hemorrhage, hydrocephalus, extra-axial collection or mass lesion/mass effect. Vascular: No hyperdense vessel identified. Skull: No acute fracture. Sinuses/Orbits: No acute finding. Other: No mastoid effusions. CT CERVICAL SPINE FINDINGS Alignment: Reversal of the normal cervical lordosis. No substantial sagittal subluxation.  Skull base and vertebrae: No acute fracture. Soft tissues and spinal canal: No prevertebral fluid or swelling. No visible canal hematoma. Disc levels: Degenerative disc disease, greatest on the right at C4-C5 where there is disc height loss and endplate spurring. Upper chest: Visualized lung apices are clear. IMPRESSION: 1. No evidence of acute intracranial abnormality. 2. No evidence of acute fracture or traumatic malalignment in the cervical spine. Electronically Signed   By: Stevenson Elbe M.D.   On: 02/09/2024 03:45   CT Cervical Spine Wo Contrast Result Date: 02/09/2024 CLINICAL DATA:  Polytrauma, blunt EXAM: CT  HEAD WITHOUT CONTRAST CT CERVICAL SPINE WITHOUT CONTRAST TECHNIQUE: Multidetector CT imaging of the head and cervical spine was performed following the standard protocol without intravenous contrast. Multiplanar CT image reconstructions of the cervical spine were also generated. RADIATION DOSE REDUCTION: This exam was performed according to the departmental dose-optimization program which includes automated exposure control, adjustment of the mA and/or kV according to patient size and/or use of iterative reconstruction technique. COMPARISON:  None Available. FINDINGS: CT HEAD FINDINGS Brain: No evidence of acute infarction, hemorrhage, hydrocephalus, extra-axial collection or mass lesion/mass effect. Vascular: No hyperdense vessel identified. Skull: No acute fracture. Sinuses/Orbits: No acute finding. Other: No mastoid effusions. CT CERVICAL SPINE FINDINGS Alignment: Reversal of the normal cervical lordosis. No substantial sagittal subluxation. Skull base and vertebrae: No acute fracture. Soft tissues and spinal canal: No prevertebral fluid or swelling. No visible canal hematoma. Disc levels: Degenerative disc disease, greatest on the right at C4-C5 where there is disc height loss and endplate spurring. Upper chest: Visualized lung apices are clear. IMPRESSION: 1. No evidence of acute intracranial abnormality. 2. No evidence of acute fracture or traumatic malalignment in the cervical spine. Electronically Signed   By: Stevenson Elbe M.D.   On: 02/09/2024 03:45   CT Lumbar Spine Wo Contrast Result Date: 02/09/2024 CLINICAL DATA:  Marvell Slider onto right side. Right hip pain and elbow pain. EXAM: CT LUMBAR SPINE WITHOUT CONTRAST TECHNIQUE: Multidetector CT imaging of the lumbar spine was performed without intravenous contrast administration. Multiplanar CT image reconstructions were also generated. RADIATION DOSE REDUCTION: This exam was performed according to the departmental dose-optimization program which includes  automated exposure control, adjustment of the mA and/or kV according to patient size and/or use of iterative reconstruction technique. COMPARISON:  CT abdomen pelvis 03/06/2022. FINDINGS: Segmentation: 5 lumbar type vertebrae. Alignment: No evidence of traumatic listhesis. Vertebrae: No acute fracture. Paraspinal and other soft tissues: Negative. Disc levels: Multilevel age commensurate spondylosis, disc space height loss, degenerative endplate changes greatest at L2-L3 and L3-L4. Moderate lower lumbar facet arthropathy. IMPRESSION: No evidence of acute fracture or traumatic listhesis. Electronically Signed   By: Rozell Cornet M.D.   On: 02/09/2024 03:44    Positive ROS: All other systems have been reviewed and were otherwise negative with the exception of those mentioned in the HPI and as above.  Physical Exam: General: Alert, no acute distress Cardiovascular: No pedal edema Respiratory: No cyanosis, no use of accessory musculature GI: No organomegaly, abdomen is soft and non-tender Skin: No lesions in the area of chief complaint Neurologic: Sensation intact distally Psychiatric: Patient is competent for consent with normal mood and affect Lymphatic: No axillary or cervical lymphadenopathy  MUSCULOSKELETAL:  Examination of the right hip reveals no skin wounds or lesions. Pain with palpation over trochanteric region. Pain with hip motion.  Sensory and motor function intact in LE bilaterally, including plantar flexion, dorsiflexion, and EHL. Distal pedal pulses 2+ bilaterally. Calves soft  and non-tender.   Assessment: Acute right subcapital femoral neck fracture.  CKD  Plan: I discussed the patient. We reviewed her x-rays. She has unstable right hip fracture that will require surgical treatment for pain control and allow immediate mobilization out of bed. [facility] has already been consulted for admission and perioperative medical optimization. Plan for right total hip arthroplasty today.  Discussed R/B/A. See statement of risk. Patient to remain NPO. Hold chemical DVT ppx. She did have dose of heparin this morning at 0518. All questions solicited and answered.   The risks, benefits, and alternatives were discussed with the patient. There are risks associated with the surgery including, but not limited to, problems with anesthesia (death), infection, differences in leg length/angulation/rotation, fracture of bones, loosening or failure of implants, malunion, nonunion, hematoma (blood accumulation) which may require surgical drainage, blood clots, pulmonary embolism, nerve injury (foot drop), and blood vessel injury. The patient understands these risks and elects to proceed.     Harman Lightning, PA-C    02/09/2024 10:47 AM      Addendum: Agree with PA note. Proceed with R THA today.  MDM performed by myself.  Jonnie Nettles, MD 12:17 PM

## 2024-02-09 NOTE — ED Notes (Signed)
 Pt readjusted in bed, SpO2 improved.

## 2024-02-10 ENCOUNTER — Encounter (HOSPITAL_COMMUNITY): Payer: Self-pay | Admitting: Orthopedic Surgery

## 2024-02-10 DIAGNOSIS — S72001A Fracture of unspecified part of neck of right femur, initial encounter for closed fracture: Secondary | ICD-10-CM | POA: Diagnosis not present

## 2024-02-10 MED ORDER — ASPIRIN 81 MG PO CHEW
81.0000 mg | CHEWABLE_TABLET | Freq: Two times a day (BID) | ORAL | 0 refills | Status: AC
Start: 2024-02-10 — End: 2024-03-26

## 2024-02-10 MED ORDER — HYDROCODONE-ACETAMINOPHEN 5-325 MG PO TABS: 1.0000 | ORAL_TABLET | ORAL | 0 refills | Status: AC | PRN

## 2024-02-10 NOTE — Evaluation (Addendum)
 Physical Therapy Evaluation Patient Details Name: Tasha Scott MRN: [id number] DOB: 02-07-1936 Today's Date: 02/10/2024  History of Present Illness  Tasha Scott is a 88 y.o. female presents after fall at home resulting in displaced R femoral neck fracture; s/p R THA AA 02/09/24. PMH: prediabetes, hyperlipidemia, hypothyroidism, mild cognitive impairment and depressive disorder  Clinical Impression  Pt is s/p R THA resulting in the deficits listed below (see PT Problem List). Pt from home alone, ind at baseline, using SPC in the community, completes minimal household chores, eats out with sister and has cleaning lady. On eval, pt able to complete ankle pump and quad set, needing assistance with heel slide and SLR. Pt reports numbness at proximal thigh over incision site; dressing noted to be intact. Pt needing min A with mobilizing to EOB, increased time and cues for hand placement, assistance for R LE and trunk uprighting. Pt needing min A for transfers and limited steps in room, heavy use of BUE on RW to decrease RLE weightbearing, limited by pain, but does tolerate transferring to recliner. Pt's pain 8/10 with weightbearing and improves to 3/10 with seated rest. Pt left on 2L due to desat to 88% on RA with mobility. Patient will benefit from continued inpatient follow up therapy, <3 hours/day prior to return home alone. Pt will benefit from acute skilled PT to increase their independence and safety with mobility to facilitate discharge.          If plan is discharge home, recommend the following: A little help with walking and/or transfers;A little help with bathing/dressing/bathroom;Assistance with cooking/housework;Assist for transportation   Can travel by private vehicle   Yes    Equipment Recommendations Rolling walker (2 wheels)  Recommendations for Other Services       Functional Status Assessment Patient has had a recent decline in their functional status and demonstrates the  ability to make significant improvements in function in a reasonable and predictable amount of time.     Precautions / Restrictions Precautions Precautions: Fall Restrictions Weight Bearing Restrictions Per Provider Order: No RLE Weight Bearing Per Provider Order: Weight bearing as tolerated      Mobility  Bed Mobility Overal bed mobility: Needs Assistance Bed Mobility: Supine to Sit     Supine to sit: Min assist, HOB elevated, Used rails     General bed mobility comments: min A to come to sitting EOB wtih therapist cuing pt to use bedrails and having elevated HOB, pt needing assistance for RLE and light trunk assist    Transfers Overall transfer level: Needs assistance Equipment used: Rolling walker (2 wheels) Transfers: Sit to/from Stand Sit to Stand: Min assist, From elevated surface           General transfer comment: min A for STS reps from elevated bed, RLE positioned slightly anterior for comfort with rising and lowering    Ambulation/Gait Ambulation/Gait assistance: Min assist Gait Distance (Feet): 3 Feet Assistive device: Rolling walker (2 wheels) Gait Pattern/deviations: Step-to pattern, Decreased weight shift to right, Decreased stance time - right Gait velocity: decreased     General Gait Details: step-to gait pattern with decreased RLE stance time and weight bearing, no LE buckling noted, limited in distance by pain, min A to steady with limited forward steps to recliner  Stairs            Wheelchair Mobility     Tilt Bed    Modified Rankin (Stroke Patients Only)       Balance Overall  balance assessment: Needs assistance Sitting-balance support: Feet supported Sitting balance-Leahy Scale: Good     Standing balance support: Reliant on assistive device for balance, During functional activity, Bilateral upper extremity supported Standing balance-Leahy Scale: Poor                               Pertinent Vitals/Pain Pain  Assessment Pain Assessment: 0-10 Pain Score: 8  Pain Location: R hip with weightbearing Pain Descriptors / Indicators: Sharp Pain Intervention(s): Limited activity within patient's tolerance, Monitored during session, Repositioned, Ice applied    Home Living Family/patient expects to be discharged to:: Private residence Living Arrangements: Alone   Type of Home:  (townhome) Home Access: Level entry       Home Layout: One level Home Equipment: Cane - single point      Prior Function Prior Level of Function : Independent/Modified Independent;Working/employed             Mobility Comments: pt reports ind with home amb, using SPC in the community, reports this is the only fall in the last 6 months ADLs Comments: pt reports ind with simple meals and light cleaning, prefers to dine with sister at [facility] and has cleaning lady     Extremity/Trunk Assessment   Upper Extremity Assessment Upper Extremity Assessment: Overall WFL for tasks assessed    Lower Extremity Assessment Lower Extremity Assessment: RLE deficits/detail RLE Deficits / Details: able to perform ankle pump, quad set; assist to complete heel slide and unable to perform SLR, reports decreased sensation throughout proximal thigh RLE Sensation: decreased light touch RLE Coordination: WNL    Cervical / Trunk Assessment Cervical / Trunk Assessment: Normal  Communication   Communication Communication: No apparent difficulties    Cognition Arousal: Alert Behavior During Therapy: WFL for tasks assessed/performed   PT - Cognitive impairments: No apparent impairments                         Following commands: Intact       Cueing       General Comments General comments (skin integrity, edema, etc.): pt on 2L O2 SpO2 95% while supine; placed on RA and desat to 88% with mobility, returned 2L with SpO2 >92%; left on 2L at EOS and notified RN    Exercises     Assessment/Plan    PT  Assessment Patient needs continued PT services  PT Problem List Decreased strength;Decreased activity tolerance;Decreased balance;Decreased mobility;Decreased knowledge of use of DME;Decreased safety awareness;Pain       PT Treatment Interventions DME instruction;Gait training;Functional mobility training;Therapeutic activities;Therapeutic exercise;Patient/family education;Balance training;Modalities    PT Goals (Current goals can be found in the Care Plan section)  Acute Rehab PT Goals Patient Stated Goal: regain strength and independence PT Goal Formulation: With patient Time For Goal Achievement: 02/24/24 Potential to Achieve Goals: Good    Frequency Min 3X/week     Co-evaluation               AM-PAC PT "6 Clicks" Mobility  Outcome Measure Help needed turning from your back to your side while in a flat bed without using bedrails?: A Little Help needed moving from lying on your back to sitting on the side of a flat bed without using bedrails?: A Little Help needed moving to and from a bed to a chair (including a wheelchair)?: A Little Help needed standing up from a chair using your  arms (e.g., wheelchair or bedside chair)?: A Little Help needed to walk in hospital room?: A Lot Help needed climbing 3-5 steps with a railing? : Total 6 Click Score: 15    End of Session Equipment Utilized During Treatment: Gait belt;Oxygen Activity Tolerance: Patient tolerated treatment well;Patient limited by pain Patient left: in chair;with call bell/phone within reach;with chair alarm set Nurse Communication: Mobility status;Other (comment) (SpO2) PT Visit Diagnosis: Unsteadiness on feet (R26.81);Other abnormalities of gait and mobility (R26.89);Muscle weakness (generalized) (M62.81);Pain Pain - Right/Left: Left Pain - part of body: Hip    Time: 9811-9147 PT Time Calculation (min) (ACUTE ONLY): 25 min   Charges:   PT Evaluation $PT Eval Low Complexity: 1 Low PT  Treatments $Therapeutic Activity: 8-22 mins PT General Charges $$ ACUTE PT VISIT: 1 Visit         Tori Pranish Akhavan PT, DPT 02/10/24, 9:44 AM

## 2024-02-10 NOTE — Progress Notes (Signed)
 Physical Therapy Treatment Patient Details Name: Tasha Scott MRN: [id number] DOB: 04-30-1936 Today's Date: 02/10/2024   History of Present Illness Tasha Scott is a 88 y.o. female presents after fall at home resulting in displaced R femoral neck fracture; s/p R THA AA 02/09/24. PMH: prediabetes, hyperlipidemia, hypothyroidism, mild cognitive impairment and depressive disorder    PT Comments  Pt in restroom upon arrival, CGA for STS transfer, verbal cues for hand placement and RW positioning to improve safety. Pt able to amb 70 ft with RW, step-to gait pattern, decrease R stance time and weight bearing, no LE buckling noted, increased time and cues with turns to improve safety and RW management. Upon return to room, pt requesting to order lunch; all needs in reach.   If plan is discharge home, recommend the following: A little help with walking and/or transfers;A little help with bathing/dressing/bathroom;Assistance with cooking/housework;Assist for transportation   Can travel by private vehicle     Yes  Equipment Recommendations  Rolling walker (2 wheels)    Recommendations for Other Services       Precautions / Restrictions Precautions Precautions: Fall Restrictions Weight Bearing Restrictions Per Provider Order: No RLE Weight Bearing Per Provider Order: Weight bearing as tolerated     Mobility  Bed Mobility    Transfers Overall transfer level: Needs assistance Equipment used: Rolling walker (2 wheels) Transfers: Sit to/from Stand Sit to Stand: Contact guard assist           General transfer comment: pt received on toilet, able to perform STS with CGA with cues for hand placment to power up, CGA for safety    Ambulation/Gait Ambulation/Gait assistance: Contact guard assist Gait Distance (Feet): 70 Feet Assistive device: Rolling walker (2 wheels) Gait Pattern/deviations: Step-to pattern, Decreased weight shift to right, Decreased stance time - right Gait  velocity: decreased     General Gait Details: step-to gait pattern, decreased RLE stance and weight bearing, slow cadence, increased time and steps wtih turns, no LE buckling noted   Stairs             Wheelchair Mobility     Tilt Bed    Modified Rankin (Stroke Patients Only)       Balance Overall balance assessment: Needs assistance Sitting-balance support: Feet supported Sitting balance-Leahy Scale: Good     Standing balance support: Reliant on assistive device for balance, During functional activity, Bilateral upper extremity supported Standing balance-Leahy Scale: Poor                              Communication Communication Communication: No apparent difficulties  Cognition Arousal: Alert Behavior During Therapy: WFL for tasks assessed/performed   PT - Cognitive impairments: No apparent impairments                         Following commands: Intact      Cueing    Exercises      General Comments General comments (skin integrity, edema, etc.): pt on RA with amb, once returned to room able to check SpO2 and reads 92%      Pertinent Vitals/Pain Pain Assessment Pain Assessment: Faces Pain Score: 8  Faces Pain Scale: Hurts little more Pain Location: R hip Pain Descriptors / Indicators: Sharp Pain Intervention(s): Limited activity within patient's tolerance, Monitored during session, Repositioned    Home Living Family/patient expects to be discharged to:: Private residence Living Arrangements: Alone  Type of Home:  (townhome) Home Access: Level entry       Home Layout: One level Home Equipment: Cane - single point      Prior Function            PT Goals (current goals can now be found in the care plan section) Acute Rehab PT Goals Patient Stated Goal: regain strength and independence PT Goal Formulation: With patient Time For Goal Achievement: 02/24/24 Potential to Achieve Goals: Good Progress towards PT goals:  Progressing toward goals    Frequency    Min 3X/week      PT Plan      Co-evaluation              AM-PAC PT "6 Clicks" Mobility   Outcome Measure  Help needed turning from your back to your side while in a flat bed without using bedrails?: A Little Help needed moving from lying on your back to sitting on the side of a flat bed without using bedrails?: A Little Help needed moving to and from a bed to a chair (including a wheelchair)?: A Little Help needed standing up from a chair using your arms (e.g., wheelchair or bedside chair)?: A Little Help needed to walk in hospital room?: A Little Help needed climbing 3-5 steps with a railing? : Total 6 Click Score: 16    End of Session Equipment Utilized During Treatment: Gait belt Activity Tolerance: Patient tolerated treatment well Patient left: in chair;with call bell/phone within reach;with chair alarm set Nurse Communication: Mobility status;Other (comment) (SpO2) PT Visit Diagnosis: Unsteadiness on feet (R26.81);Other abnormalities of gait and mobility (R26.89);Muscle weakness (generalized) (M62.81);Pain Pain - Right/Left: Left Pain - part of body: Hip     Time: 1610-9604 PT Time Calculation (min) (ACUTE ONLY): 16 min  Charges:    $Gait Training: 8-22 mins PT General Charges $$ ACUTE PT VISIT: 1 Visit                     Tori Nataliah Hatlestad PT, DPT 02/10/24, 1:32 PM

## 2024-02-10 NOTE — TOC PASRR Note (Signed)
 30 Day PASRR Note   Patient Details  Name: Tasha Scott Date of Birth: 1936/09/08   Transition of Care [facility] CM/SW Contact:    Bari Leys, RN Phone Number: 02/10/2024, 2:10 PM  To Whom It May Concern:  Please be advised that this patient will require a short-term nursing home stay - anticipated 30 days or less for rehabilitation and strengthening.   The plan is for return home.

## 2024-02-10 NOTE — Progress Notes (Signed)
    Subjective:  Patient reports pain as mild to moderate.  Denies N/V/CP/SOB/Abd pain. She denies any tingling or numbness in LE bilaterally. She reports her pain is doing okay.  Objective:   VITALS:   Vitals:   02/09/24 1814 02/09/24 2018 02/10/24 0131 02/10/24 0603  BP: 130/65 (!) 136/52 (!) 110/48 (!) 122/45  Pulse: (!) 59 66 75 74  Resp: 16 18 18 18   Temp: 97.7 F (36.5 C) 98.9 F (37.2 C) 98.4 F (36.9 C) 99.1 F (37.3 C)  TempSrc:  Oral Oral Oral  SpO2: 97% 96% 92% 95%  Weight:      Height:        NAD Neurologically intact ABD soft Neurovascular intact Sensation intact distally Intact pulses distally Dorsiflexion/Plantar flexion intact Incision: dressing C/D/I No cellulitis present Compartment soft   Lab Results  Component Value Date   WBC 8.7 02/09/2024   HGB 11.5 (L) 02/09/2024   HCT 35.6 (L) 02/09/2024   MCV 93.9 02/09/2024   PLT 210 02/09/2024   BMET    Component Value Date/Time   NA 141 02/09/2024 0500   K 3.7 02/09/2024 0500   CL 107 02/09/2024 0500   CO2 25 02/09/2024 0500   GLUCOSE 112 (H) 02/09/2024 0500   BUN 22 02/09/2024 0500   CREATININE 0.87 02/09/2024 0500   CALCIUM  9.0 02/09/2024 0500   GFRNONAA >60 02/09/2024 0500     Assessment/Plan: 1 Day Post-Op   Principal Problem:   Closed displaced fracture of right femoral neck (HCC) Active Problems:   Hypothyroidism   Mild cognitive impairment   Fall at home, initial encounter   Essential hypertension   History of depression   WBAT with walker DVT ppx: Aspirin , SCDs, TEDS PO pain control PT/OT: To come today.  Dispo:  - Patient under care of the medical team, disposition per their recommendation. Pain medication and DVT ppx printed in chart.    Harman Lightning 02/10/2024, 8:41 AM   [facility]  [place] [address], [place], [place] [address] Phone: [phone] [email or web address] Facebook  [employer]

## 2024-02-10 NOTE — Progress Notes (Signed)
 PROGRESS NOTE    Tasha Scott  GNF:[id number] DOB: 1936/08/21 DOA: 02/09/2024 PCP: Aida House, MD   Brief Narrative:  This 88 years old female with PMH significant for prediabetes, hyperlipidemia, hypothyroidism, mild cognitive impairment and depressive disorder presented in the ED status post mechanical fall after slipping on her socks, She landed on her right side.  She was unable to stand,  complaining about having right-sided hip pain and right elbow pain.  She denies any head injury or loss of consciousness.  In the ED, She is found to have acute displaced impacted subcapital femoral neck fracture on the right femur.  CT lumbar spine,  C-spine and CT head unremarkable. Patient was admitted for further evaluation.    Assessment & Plan:   Principal Problem:   Closed displaced fracture of right femoral neck (HCC) Active Problems:   Fall at home, initial encounter   Hypothyroidism   Mild cognitive impairment   Essential hypertension   History of depression  Acute displaced right femoral neck fracture status post mechanical fall: Patient presented status post mechanical fall on her sock.  She landed on her right side. CT head, CT cervical spine and CT lumbar spine unremarkable. CT pelvis showed acute displaced impacted subcapital femoral neck fracture of the right femur. Continue adequate pain control. Orthopedic surgeon Dr. Charol Copas has been consulted by ED physician Dr. Maralee Senate.   Patient is status post Right total hip arthroplasty anterior approach.  Postoperative day 1. PT and OT recommended skilled nursing facility for rehab   Hypothyroidism: Continue levothyroxine  50 mcg TTS schedule and 75 mcg MWF schedule   Essential hypertension: Not on any antihypertensive medication.  Continue hydralazine as needed.   Mild cognitive impairment Continue memantine  twice daily.   History of depression: Continue Zoloft  and Atarax  as needed.   Hyperlipidemia: Can restart  Lipitor postsurgery.   DVT prophylaxis: SCDs Code Status: Full code Family Communication: No family at bedside Disposition Plan:     Status is: Inpatient Remains inpatient appropriate because: Admitted for right hip fracture s/p ORIF POD #1.   Consultants:  Orthopedics  Procedures: ORIF Antimicrobials: None  Subjective: Patient was seen and examined at bedside.  Overnight events noted.   She was sitting on the chair,  She had participated with physical therapy after surgery.  She did well.  Objective: Vitals:   02/09/24 2018 02/10/24 0131 02/10/24 0603 02/10/24 0949  BP: (!) 136/52 (!) 110/48 (!) 122/45 (!) 116/45  Pulse: 66 75 74 63  Resp: 18 18 18 16   Temp: 98.9 F (37.2 C) 98.4 F (36.9 C) 99.1 F (37.3 C) 97.7 F (36.5 C)  TempSrc: Oral Oral Oral Oral  SpO2: 96% 92% 95% 97%  Weight:      Height:        Intake/Output Summary (Last 24 hours) at 02/10/2024 1145 Last data filed at 02/10/2024 0818 Gross per 24 hour  Intake 2083.67 ml  Output 1125 ml  Net 958.67 ml   Filed Weights   02/09/24 0608  Weight: 82 kg    Examination:  General exam: Appears calm and comfortable,  not in any acute distress. Respiratory system: CTA bilaterally. Respiratory effort normal.  RR 15 Cardiovascular system: S1 & S2 heard, RRR. No JVD, murmurs, rubs, gallops or clicks.  Gastrointestinal system: Abdomen is non distended, soft and non tender.  Normal bowel sounds heard. Central nervous system: Alert and oriented X 3. No focal neurological deficits. Extremities: Status post right hip ORIF POD #1 Skin: No  rashes, lesions or ulcers Psychiatry: Judgement and insight appear normal. Mood & affect appropriate.     Data Reviewed: I have personally reviewed following labs and imaging studies  CBC: Recent Labs  Lab 02/09/24 0341 02/09/24 0356 02/09/24 0500  WBC 10.0  --  8.7  NEUTROABS 8.4*  --   --   HGB 12.5 12.2 11.5*  HCT 37.3 36.0 35.6*  MCV 91.4  --  93.9  PLT 228  --   210   Basic Metabolic Panel: Recent Labs  Lab 02/09/24 0356 02/09/24 0500  NA 142 141  K 3.8 3.7  CL 107 107  CO2  --  25  GLUCOSE 100* 112*  BUN 24* 22  CREATININE 1.00 0.87  CALCIUM   --  9.0   GFR: Estimated Creatinine Clearance: 49.1 mL/min (by C-G formula based on SCr of 0.87 mg/dL). Liver Function Tests: Recent Labs  Lab 02/09/24 0500  AST 31  ALT 23  ALKPHOS 46  BILITOT 0.6  PROT 6.4*  ALBUMIN 3.7   No results for input(s): "LIPASE", "AMYLASE" in the last 168 hours. No results for input(s): "AMMONIA" in the last 168 hours. Coagulation Profile: No results for input(s): "INR", "PROTIME" in the last 168 hours. Cardiac Enzymes: No results for input(s): "CKTOTAL", "CKMB", "CKMBINDEX", "TROPONINI" in the last 168 hours. BNP (last 3 results) No results for input(s): "PROBNP" in the last 8760 hours. HbA1C: No results for input(s): "HGBA1C" in the last 72 hours. CBG: No results for input(s): "GLUCAP" in the last 168 hours. Lipid Profile: No results for input(s): "CHOL", "HDL", "LDLCALC", "TRIG", "CHOLHDL", "LDLDIRECT" in the last 72 hours. Thyroid  Function Tests: No results for input(s): "TSH", "T4TOTAL", "FREET4", "T3FREE", "THYROIDAB" in the last 72 hours. Anemia Panel: No results for input(s): "VITAMINB12", "FOLATE", "FERRITIN", "TIBC", "IRON", "RETICCTPCT" in the last 72 hours. Sepsis Labs: No results for input(s): "PROCALCITON", "LATICACIDVEN" in the last 168 hours.  Recent Results (from the past 240 hours)  Surgical PCR screen     Status: None   Collection Time: 02/09/24  6:52 AM   Specimen: Nasal Mucosa; Nasal Swab  Result Value Ref Range Status   MRSA, PCR NEGATIVE NEGATIVE Final   Staphylococcus aureus NEGATIVE NEGATIVE Final    Comment: (NOTE) The Xpert SA Assay (FDA approved for NASAL specimens in patients 67 years of age and older), is one component of a comprehensive surveillance program. It is not intended to diagnose infection nor to guide or  monitor treatment. Performed at [facility], [address] [place], [place] [address]     Radiology Studies: DG HIP UNILAT W OR W/O PELVIS 2-3 VIEWS RIGHT Result Date: 02/09/2024 CLINICAL DATA:  Postoperative evaluation. EXAM: DG HIP (WITH OR WITHOUT PELVIS) 2-3V RIGHT COMPARISON:  Same day radiographs of the right hip dated 02/09/2024 at 5:52 a.m. FINDINGS: Status post right total hip arthroplasty with appropriate alignment. No acute fracture or dislocation. Sacroiliac joints and pubic symphysis are anatomically aligned. Mild degenerative changes of the left hip. Expected postoperative soft tissue swelling, soft tissue air, and cutaneous staples along the right hip. IMPRESSION: Expected postoperative changes status post right total hip arthroplasty with appropriate alignment. No acute complication. Electronically Signed   By: Mannie Seek M.D.   On: 02/09/2024 17:31   DG HIP UNILAT WITH PELVIS 1V RIGHT Result Date: 02/09/2024 CLINICAL DATA:  Right total hip arthroplasty. EXAM: DG HIP (WITH OR WITHOUT PELVIS) 1V RIGHT COMPARISON:  Right hip radiograph dated 02/09/2024 at 5:52 a.m. FINDINGS: Images were performed  intraoperatively without the presence of a radiologist. Interval right total hip arthroplasty. Normal alignment. Total fluoroscopy images: 2 Total fluoroscopy time: 12 seconds Total dose: Radiation Exposure Index (as provided by the fluoroscopic device): 1.56 mGy air Kerma Please see intraoperative findings for further detail. IMPRESSION: Intraoperative fluoroscopic spot images of interval right total hip arthroplasty. Normal alignment. Electronically Signed   By: Mannie Seek M.D.   On: 02/09/2024 15:27   DG C-Arm 1-60 Min-No Report Result Date: 02/09/2024 Fluoroscopy was utilized by the requesting physician.  No radiographic interpretation.   DG C-Arm 1-60 Min-No Report Result Date: 02/09/2024 Fluoroscopy was utilized by the requesting physician.  No radiographic  interpretation.   DG Knee Right Port Result Date: 02/09/2024 CLINICAL DATA:  Right femoral neck fracture. EXAM: PORTABLE RIGHT KNEE - 1-2 VIEW COMPARISON:  Right hip radiograph dated 02/09/2024. FINDINGS: There is a total right knee arthroplasty. The arthroplasty components appear intact and in anatomic alignment. There is no acute fracture or dislocation. The bones are well mineralized. No significant joint effusion. The soft tissues are unremarkable. IMPRESSION: 1. No acute fracture or dislocation. 2. Total right knee arthroplasty. Electronically Signed   By: Angus Bark M.D.   On: 02/09/2024 11:05   DG HIP UNILAT W OR W/O PELVIS 2-3 VIEWS RIGHT Result Date: 02/09/2024 CLINICAL DATA:  Preop for hip fracture EXAM: DG HIP (WITH OR WITHOUT PELVIS) 3V RIGHT COMPARISON:  CT from earlier today FINDINGS: Subcapital right femoral neck fracture with displacement. Mild degenerative spurring at the right hip. No evidence of pelvic ring fracture. Elongated soft tissue calcification over the left pelvis is in the subcutaneous gluteal fat by CT. IMPRESSION: Displaced right femoral neck fracture. Mild right hip degenerative spurring. Electronically Signed   By: Ronnette Coke M.D.   On: 02/09/2024 06:16   CT PELVIS WO CONTRAST Result Date: 02/09/2024 CLINICAL DATA:  Pelvic fracture. Fell onto right side when putting on socks. EXAM: CT PELVIS WITHOUT CONTRAST TECHNIQUE: Multidetector CT imaging of the pelvis was performed following the standard protocol without intravenous contrast. RADIATION DOSE REDUCTION: This exam was performed according to the departmental dose-optimization program which includes automated exposure control, adjustment of the mA and/or kV according to patient size and/or use of iterative reconstruction technique. COMPARISON:  CT abdomen pelvis 03/06/2022 FINDINGS: Urinary Tract:  No abnormality visualized. Bowel:  Unremarkable visualized pelvic bowel loops. Vascular/Lymphatic: No pathologically  enlarged lymph nodes. No significant vascular abnormality seen. Reproductive:  No mass or other significant abnormality Other:  None. Musculoskeletal: Acute mildly displaced mildly impacted subcapital femoral neck fracture of the right femur. The right femoral shaft is displaced superiorly. No dislocation. IMPRESSION: Acute displaced impacted subcapital femoral neck fracture of the right femur. Electronically Signed   By: Rozell Cornet M.D.   On: 02/09/2024 03:46   CT Head Wo Contrast Result Date: 02/09/2024 CLINICAL DATA:  Polytrauma, blunt EXAM: CT HEAD WITHOUT CONTRAST CT CERVICAL SPINE WITHOUT CONTRAST TECHNIQUE: Multidetector CT imaging of the head and cervical spine was performed following the standard protocol without intravenous contrast. Multiplanar CT image reconstructions of the cervical spine were also generated. RADIATION DOSE REDUCTION: This exam was performed according to the departmental dose-optimization program which includes automated exposure control, adjustment of the mA and/or kV according to patient size and/or use of iterative reconstruction technique. COMPARISON:  None Available. FINDINGS: CT HEAD FINDINGS Brain: No evidence of acute infarction, hemorrhage, hydrocephalus, extra-axial collection or mass lesion/mass effect. Vascular: No hyperdense vessel identified. Skull: No acute fracture. Sinuses/Orbits: No acute finding.  Other: No mastoid effusions. CT CERVICAL SPINE FINDINGS Alignment: Reversal of the normal cervical lordosis. No substantial sagittal subluxation. Skull base and vertebrae: No acute fracture. Soft tissues and spinal canal: No prevertebral fluid or swelling. No visible canal hematoma. Disc levels: Degenerative disc disease, greatest on the right at C4-C5 where there is disc height loss and endplate spurring. Upper chest: Visualized lung apices are clear. IMPRESSION: 1. No evidence of acute intracranial abnormality. 2. No evidence of acute fracture or traumatic  malalignment in the cervical spine. Electronically Signed   By: Stevenson Elbe M.D.   On: 02/09/2024 03:45   CT Cervical Spine Wo Contrast Result Date: 02/09/2024 CLINICAL DATA:  Polytrauma, blunt EXAM: CT HEAD WITHOUT CONTRAST CT CERVICAL SPINE WITHOUT CONTRAST TECHNIQUE: Multidetector CT imaging of the head and cervical spine was performed following the standard protocol without intravenous contrast. Multiplanar CT image reconstructions of the cervical spine were also generated. RADIATION DOSE REDUCTION: This exam was performed according to the departmental dose-optimization program which includes automated exposure control, adjustment of the mA and/or kV according to patient size and/or use of iterative reconstruction technique. COMPARISON:  None Available. FINDINGS: CT HEAD FINDINGS Brain: No evidence of acute infarction, hemorrhage, hydrocephalus, extra-axial collection or mass lesion/mass effect. Vascular: No hyperdense vessel identified. Skull: No acute fracture. Sinuses/Orbits: No acute finding. Other: No mastoid effusions. CT CERVICAL SPINE FINDINGS Alignment: Reversal of the normal cervical lordosis. No substantial sagittal subluxation. Skull base and vertebrae: No acute fracture. Soft tissues and spinal canal: No prevertebral fluid or swelling. No visible canal hematoma. Disc levels: Degenerative disc disease, greatest on the right at C4-C5 where there is disc height loss and endplate spurring. Upper chest: Visualized lung apices are clear. IMPRESSION: 1. No evidence of acute intracranial abnormality. 2. No evidence of acute fracture or traumatic malalignment in the cervical spine. Electronically Signed   By: Stevenson Elbe M.D.   On: 02/09/2024 03:45   CT Lumbar Spine Wo Contrast Result Date: 02/09/2024 CLINICAL DATA:  Marvell Slider onto right side. Right hip pain and elbow pain. EXAM: CT LUMBAR SPINE WITHOUT CONTRAST TECHNIQUE: Multidetector CT imaging of the lumbar spine was performed without  intravenous contrast administration. Multiplanar CT image reconstructions were also generated. RADIATION DOSE REDUCTION: This exam was performed according to the departmental dose-optimization program which includes automated exposure control, adjustment of the mA and/or kV according to patient size and/or use of iterative reconstruction technique. COMPARISON:  CT abdomen pelvis 03/06/2022. FINDINGS: Segmentation: 5 lumbar type vertebrae. Alignment: No evidence of traumatic listhesis. Vertebrae: No acute fracture. Paraspinal and other soft tissues: Negative. Disc levels: Multilevel age commensurate spondylosis, disc space height loss, degenerative endplate changes greatest at L2-L3 and L3-L4. Moderate lower lumbar facet arthropathy. IMPRESSION: No evidence of acute fracture or traumatic listhesis. Electronically Signed   By: Rozell Cornet M.D.   On: 02/09/2024 03:44   Scheduled Meds:  acetaminophen   1,000 mg Oral Q6H   aspirin   81 mg Oral BID   docusate sodium   100 mg Oral BID   levothyroxine   75 mcg Oral Q M,W,F   And   levothyroxine   50 mcg Oral Q T,Th,S,Su   memantine   5 mg Oral BID   senna  1 tablet Oral BID   sertraline   150 mg Oral Daily   Continuous Infusions:  sodium chloride      sodium chloride  Stopped (02/10/24 0205)     LOS: 1 day    Time spent: 50 mins    Magdalene School, MD [facility]  If 7PM-7AM, please contact night-coverage

## 2024-02-10 NOTE — TOC Progression Note (Signed)
 Transition of Care [facility]) - Progression Note    Patient Details  Name: Tasha Scott MRN: [id number] Date of Birth: March 06, 1936  [facility] CM/SW Contact  Bari Leys, RN Phone Number: 02/10/2024, 2:31 PM  Clinical Narrative:   PT eval completed, recommendation for short term rehab/SNF. Met with patient at bedside, patient was speaking with her daughter on her cell phone during NCM visit, both agreeable to short term rehab/SNF, no preference. FL2 updated, Level 2 PASRR pending, faxed out for bed offers. TOC will continue to follow.     Expected Discharge Plan: Skilled Nursing Facility    Expected Discharge Plan and Services       Living arrangements for the past 2 months: Single Family Home                                       Social Determinants of Health (SDOH) Interventions SDOH Screenings   Food Insecurity: No Food Insecurity (02/09/2024)  Housing: Low Risk  (02/09/2024)  Transportation Needs: No Transportation Needs (02/09/2024)  Utilities: Not At Risk (02/09/2024)  Social Connections: Socially Isolated (02/09/2024)  Tobacco Use: Unknown (02/09/2024)    Readmission Risk Interventions    02/10/2024    2:30 PM 02/09/2024    9:29 AM  Readmission Risk Prevention Plan  Post Dischage Appt Complete Complete  Medication Screening Complete Complete  Transportation Screening Complete Complete

## 2024-02-10 NOTE — NC FL2 (Signed)
 [place]  MEDICAID FL2 LEVEL OF CARE FORM     IDENTIFICATION  Patient Name: Tasha Scott Birthdate: 02-13-1936 Sex: female Admission Date (Current Location): 02/09/2024  [facility] and [place] Number:  Producer, television/film/video and Address:  [facility],  [address], [place] [address]      Provider Number: [id number]  Attending Physician Name and Address:  Magdalene School, MD  Relative Name and Phone Number:  Dewayne Ford (Daughter)  [phone] (Home Phone)    Current Level of Care: Hospital Recommended Level of Care: Skilled Nursing Facility Prior Approval Number:    Date Approved/Denied:   PASRR Number: pending  Discharge Plan: SNF    Current Diagnoses: Patient Active Problem List   Diagnosis Date Noted   Closed displaced fracture of right femoral neck (HCC) 02/09/2024   Fall at home, initial encounter 02/09/2024   Essential hypertension 02/09/2024   History of depression 02/09/2024   Acute insomnia 05/31/2023   Mild cognitive impairment 05/27/2023   Tinnitus of both ears 11/01/2022   Depressive disorder 10/29/2021   Steatosis of liver 10/29/2021   Hypothyroidism 10/27/2021   Chronic kidney disease, stage 3a (HCC) 10/27/2021   Hyperlipidemia 10/27/2021   Prediabetes 10/27/2021    Orientation RESPIRATION BLADDER Height & Weight     Self, Time, Situation, Place  Normal Continent, External catheter Weight: 82 kg Height:  5\' 5"  (165.1 cm)  BEHAVIORAL SYMPTOMS/MOOD NEUROLOGICAL BOWEL NUTRITION STATUS      Continent Diet (regular)  AMBULATORY STATUS COMMUNICATION OF NEEDS Skin   Limited Assist Verbally Surgical wounds (right hip arthroplasty)                       Personal Care Assistance Level of Assistance  Bathing, Feeding, Dressing Bathing Assistance: Limited assistance Feeding assistance: Limited assistance Dressing Assistance: Limited assistance     Functional Limitations Info  Sight, Hearing, Speech Sight Info: Impaired Hearing  Info: Adequate Speech Info: Adequate    SPECIAL CARE FACTORS FREQUENCY  PT (By licensed PT), OT (By licensed OT)     PT Frequency: 5x/wk OT Frequency: 5x/wk            Contractures Contractures Info: Not present    Additional Factors Info  Code Status, Allergies, Psychotropic Code Status Info: Full Code Allergies Info: Codeine Psychotropic Info: Zoloft  150mg  po daily         Current Medications (02/10/2024):  This is the current hospital active medication list Current Facility-Administered Medications  Medication Dose Route Frequency Provider Last Rate Last Admin   0.9 %  sodium chloride  infusion   Intravenous Continuous Swinteck, Polly Brink, MD       0.9 %  sodium chloride  infusion   Intravenous Continuous Swinteck, Polly Brink, MD   Stopped at 02/10/24 0205   acetaminophen  (TYLENOL ) tablet 1,000 mg  1,000 mg Oral Q6H Swinteck, Polly Brink, MD   1,000 mg at 02/10/24 1341   acetaminophen  (TYLENOL ) tablet 325-650 mg  325-650 mg Oral Q6H PRN Swinteck, Polly Brink, MD       alum & mag hydroxide-simeth (MAALOX/MYLANTA) 200-200-20 MG/5ML suspension 30 mL  30 mL Oral Q4H PRN Swinteck, Polly Brink, MD       aspirin  chewable tablet 81 mg  81 mg Oral BID Adonica Hoose, MD   81 mg at 02/10/24 1024   diphenhydrAMINE  (BENADRYL ) 12.5 MG/5ML elixir 12.5-25 mg  12.5-25 mg Oral Q4H PRN Swinteck, Polly Brink, MD       docusate sodium  (COLACE) capsule 100 mg  100 mg Oral BID  Adonica Hoose, MD   100 mg at 02/10/24 1024   HYDROmorphone  (DILAUDID ) injection 0.5-1 mg  0.5-1 mg Intravenous Q4H PRN Swinteck, Polly Brink, MD       hydrOXYzine  (ATARAX ) tablet 25 mg  25 mg Oral Q6H PRN Swinteck, Polly Brink, MD       levothyroxine  (SYNTHROID ) tablet 75 mcg  75 mcg Oral Q M,W,F Swinteck, Brian, MD   75 mcg at 02/10/24 0541   And   levothyroxine  (SYNTHROID ) tablet 50 mcg  50 mcg Oral Q T,Th,S,Su Adonica Hoose, MD   50 mcg at 02/09/24 0606   memantine  (NAMENDA ) tablet 5 mg  5 mg Oral BID Adonica Hoose, MD   5 mg at 02/10/24 1024    menthol -cetylpyridinium (CEPACOL) lozenge 3 mg  1 lozenge Oral PRN Swinteck, Polly Brink, MD       Or   phenol (CHLORASEPTIC) mouth spray 1 spray  1 spray Mouth/Throat PRN Swinteck, Polly Brink, MD       methocarbamol  (ROBAXIN ) tablet 500 mg  500 mg Oral Q6H PRN Adonica Hoose, MD   500 mg at 02/10/24 0540   Or   methocarbamol  (ROBAXIN ) injection 500 mg  500 mg Intravenous Q6H PRN Swinteck, Polly Brink, MD       metoCLOPramide  (REGLAN ) tablet 5-10 mg  5-10 mg Oral Q8H PRN Swinteck, Polly Brink, MD       Or   metoCLOPramide  (REGLAN ) injection 5-10 mg  5-10 mg Intravenous Q8H PRN Swinteck, Polly Brink, MD       ondansetron  (ZOFRAN ) tablet 4 mg  4 mg Oral Q6H PRN Swinteck, Polly Brink, MD       Or   ondansetron  (ZOFRAN ) injection 4 mg  4 mg Intravenous Q6H PRN Swinteck, Polly Brink, MD       oxyCODONE  (Oxy IR/ROXICODONE ) immediate release tablet 10-15 mg  10-15 mg Oral Q4H PRN Swinteck, Polly Brink, MD       oxyCODONE  (Oxy IR/ROXICODONE ) immediate release tablet 5-10 mg  5-10 mg Oral Q4H PRN Swinteck, Polly Brink, MD   5 mg at 02/10/24 0539   polyethylene glycol (MIRALAX  / GLYCOLAX ) packet 17 g  17 g Oral Daily PRN Adonica Hoose, MD       senna (SENOKOT) tablet 8.6 mg  1 tablet Oral BID Adonica Hoose, MD   8.6 mg at 02/10/24 1024   sertraline  (ZOLOFT ) tablet 150 mg  150 mg Oral Daily Adonica Hoose, MD   150 mg at 02/10/24 1023     Discharge Medications: Please see discharge summary for a list of discharge medications.  Relevant Imaging Results:  Relevant Lab Results:   Additional Information SSN: [id number]  Bari Leys, RN

## 2024-02-10 NOTE — Anesthesia Postprocedure Evaluation (Signed)
 Anesthesia Post Note  Patient: Tasha Scott  Procedure(s) Performed: ARTHROPLASTY, HIP, TOTAL, ANTERIOR APPROACH (Right: Hip)     Patient location during evaluation: PACU Anesthesia Type: Spinal Level of consciousness: oriented and awake and alert Pain management: pain level controlled Vital Signs Assessment: post-procedure vital signs reviewed and stable Respiratory status: spontaneous breathing, respiratory function stable and patient connected to nasal cannula oxygen Cardiovascular status: blood pressure returned to baseline and stable Postop Assessment: no headache, no backache and no apparent nausea or vomiting Anesthetic complications: no  No notable events documented.  Last Vitals:  Vitals:   02/10/24 1404 02/10/24 1758  BP: 114/83 (!) 144/47  Pulse: 68 66  Resp: 17 17  Temp: 37.1 C 36.8 C  SpO2: 96% 94%    Last Pain:  Vitals:   02/10/24 1404  TempSrc: Oral  PainSc:    Pain Goal: Patients Stated Pain Goal: 3 (02/10/24 1025)                 Laveah Gloster L Shyah Cadmus

## 2024-02-10 NOTE — Plan of Care (Signed)

## 2024-02-11 DIAGNOSIS — S72001A Fracture of unspecified part of neck of right femur, initial encounter for closed fracture: Secondary | ICD-10-CM | POA: Diagnosis not present

## 2024-02-11 NOTE — Progress Notes (Signed)
 PROGRESS NOTE    Tasha Scott  ZOX:[id number] DOB: 06/11/1936 DOA: 02/09/2024 PCP: Aida House, MD   Brief Narrative:  This 88 years old female with PMH significant for prediabetes, hyperlipidemia, hypothyroidism, mild cognitive impairment and depressive disorder presented in the ED status post mechanical fall after slipping on her socks, She landed on her right side.  She was unable to stand,  complaining about having right-sided hip pain and right elbow pain.  She denies any head injury or loss of consciousness.  In the ED, She is found to have acute displaced impacted subcapital femoral neck fracture on the right femur.  CT lumbar spine,  C-spine and CT head unremarkable. Patient was admitted for further evaluation.    Assessment & Plan:   Principal Problem:   Closed displaced fracture of right femoral neck (HCC) Active Problems:   Fall at home, initial encounter   Hypothyroidism   Mild cognitive impairment   Essential hypertension   History of depression  Acute displaced right femoral neck fracture status post mechanical fall: Patient presented status post mechanical fall on her sock.  She landed on her right side. CT head, CT cervical spine and CT lumbar spine unremarkable. CT pelvis showed acute displaced impacted subcapital femoral neck fracture of the right femur. Continue adequate pain control. Orthopedic surgeon Dr. Charol Copas has been consulted by ED physician Dr. Maralee Senate.   Patient is status post Right total hip arthroplasty anterior approach.  Postoperative day 2. PT and OT recommended skilled nursing facility for rehab   Hypothyroidism: Continue levothyroxine  50 mcg TTS schedule and 75 mcg MWF schedule   Essential hypertension: Not on any antihypertensive medication.  Continue hydralazine as needed.   Mild cognitive impairment Continue memantine  twice daily.   History of depression: Continue Zoloft  and Atarax  as needed.   Hyperlipidemia: Can restart  Lipitor postsurgery.   DVT prophylaxis: SCDs Code Status: Full code Family Communication: No family at bedside Disposition Plan:     Status is: Inpatient Remains inpatient appropriate because: Admitted for right hip fracture s/p ORIF POD #2.   Consultants:  Orthopedics  Procedures: ORIF Antimicrobials: None  Subjective: Patient was seen and examined at bedside.  Overnight events noted.   She had participated with physical therapy today, She did well. She is awaiting skilled nursing facility insurance authorization  Objective: Vitals:   02/10/24 1404 02/10/24 1758 02/10/24 2027 02/11/24 0631  BP: 114/83 (!) 144/47 (!) 151/55 (!) 134/59  Pulse: 68 66 75 80  Resp: 17 17 18 17   Temp: 98.7 F (37.1 C) 98.2 F (36.8 C) 99.3 F (37.4 C) 99.1 F (37.3 C)  TempSrc: Oral  Oral Oral  SpO2: 96% 94% 96% 96%  Weight:      Height:        Intake/Output Summary (Last 24 hours) at 02/11/2024 1301 Last data filed at 02/11/2024 0900 Gross per 24 hour  Intake 240 ml  Output --  Net 240 ml   Filed Weights   02/09/24 0608  Weight: 82 kg    Examination:  General exam: Appears calm and comfortable,  not in any acute distress. Respiratory system: CTA bilaterally. Respiratory effort normal.  RR 15 Cardiovascular system: S1 & S2 heard, RRR. No JVD, murmurs, rubs, gallops or clicks.  Gastrointestinal system: Abdomen is non distended, soft and non tender.  Normal bowel sounds heard. Central nervous system: Alert and oriented X 3. No focal neurological deficits. Extremities: Status post right hip ORIF POD #2 Skin: No rashes, lesions or  ulcers Psychiatry: Judgement and insight appear normal. Mood & affect appropriate.     Data Reviewed: I have personally reviewed following labs and imaging studies  CBC: Recent Labs  Lab 02/09/24 0341 02/09/24 0356 02/09/24 0500  WBC 10.0  --  8.7  NEUTROABS 8.4*  --   --   HGB 12.5 12.2 11.5*  HCT 37.3 36.0 35.6*  MCV 91.4  --  93.9  PLT  228  --  210   Basic Metabolic Panel: Recent Labs  Lab 02/09/24 0356 02/09/24 0500  NA 142 141  K 3.8 3.7  CL 107 107  CO2  --  25  GLUCOSE 100* 112*  BUN 24* 22  CREATININE 1.00 0.87  CALCIUM   --  9.0   GFR: Estimated Creatinine Clearance: 49.1 mL/min (by C-G formula based on SCr of 0.87 mg/dL). Liver Function Tests: Recent Labs  Lab 02/09/24 0500  AST 31  ALT 23  ALKPHOS 46  BILITOT 0.6  PROT 6.4*  ALBUMIN 3.7   No results for input(s): "LIPASE", "AMYLASE" in the last 168 hours. No results for input(s): "AMMONIA" in the last 168 hours. Coagulation Profile: No results for input(s): "INR", "PROTIME" in the last 168 hours. Cardiac Enzymes: No results for input(s): "CKTOTAL", "CKMB", "CKMBINDEX", "TROPONINI" in the last 168 hours. BNP (last 3 results) No results for input(s): "PROBNP" in the last 8760 hours. HbA1C: No results for input(s): "HGBA1C" in the last 72 hours. CBG: No results for input(s): "GLUCAP" in the last 168 hours. Lipid Profile: No results for input(s): "CHOL", "HDL", "LDLCALC", "TRIG", "CHOLHDL", "LDLDIRECT" in the last 72 hours. Thyroid  Function Tests: No results for input(s): "TSH", "T4TOTAL", "FREET4", "T3FREE", "THYROIDAB" in the last 72 hours. Anemia Panel: No results for input(s): "VITAMINB12", "FOLATE", "FERRITIN", "TIBC", "IRON", "RETICCTPCT" in the last 72 hours. Sepsis Labs: No results for input(s): "PROCALCITON", "LATICACIDVEN" in the last 168 hours.  Recent Results (from the past 240 hours)  Surgical PCR screen     Status: None   Collection Time: 02/09/24  6:52 AM   Specimen: Nasal Mucosa; Nasal Swab  Result Value Ref Range Status   MRSA, PCR NEGATIVE NEGATIVE Final   Staphylococcus aureus NEGATIVE NEGATIVE Final    Comment: (NOTE) The Xpert SA Assay (FDA approved for NASAL specimens in patients 63 years of age and older), is one component of a comprehensive surveillance program. It is not intended to diagnose infection nor  to guide or monitor treatment. Performed at [facility], [address] [place], [place] [address]     Radiology Studies: DG HIP UNILAT W OR W/O PELVIS 2-3 VIEWS RIGHT Result Date: 02/09/2024 CLINICAL DATA:  Postoperative evaluation. EXAM: DG HIP (WITH OR WITHOUT PELVIS) 2-3V RIGHT COMPARISON:  Same day radiographs of the right hip dated 02/09/2024 at 5:52 a.m. FINDINGS: Status post right total hip arthroplasty with appropriate alignment. No acute fracture or dislocation. Sacroiliac joints and pubic symphysis are anatomically aligned. Mild degenerative changes of the left hip. Expected postoperative soft tissue swelling, soft tissue air, and cutaneous staples along the right hip. IMPRESSION: Expected postoperative changes status post right total hip arthroplasty with appropriate alignment. No acute complication. Electronically Signed   By: Mannie Seek M.D.   On: 02/09/2024 17:31   DG HIP UNILAT WITH PELVIS 1V RIGHT Result Date: 02/09/2024 CLINICAL DATA:  Right total hip arthroplasty. EXAM: DG HIP (WITH OR WITHOUT PELVIS) 1V RIGHT COMPARISON:  Right hip radiograph dated 02/09/2024 at 5:52 a.m. FINDINGS: Images were performed intraoperatively without the  presence of a radiologist. Interval right total hip arthroplasty. Normal alignment. Total fluoroscopy images: 2 Total fluoroscopy time: 12 seconds Total dose: Radiation Exposure Index (as provided by the fluoroscopic device): 1.56 mGy air Kerma Please see intraoperative findings for further detail. IMPRESSION: Intraoperative fluoroscopic spot images of interval right total hip arthroplasty. Normal alignment. Electronically Signed   By: Mannie Seek M.D.   On: 02/09/2024 15:27   DG C-Arm 1-60 Min-No Report Result Date: 02/09/2024 Fluoroscopy was utilized by the requesting physician.  No radiographic interpretation.   DG C-Arm 1-60 Min-No Report Result Date: 02/09/2024 Fluoroscopy was utilized by the requesting physician.  No  radiographic interpretation.   Scheduled Meds:  aspirin   81 mg Oral BID   docusate sodium   100 mg Oral BID   levothyroxine   75 mcg Oral Q M,W,F   And   levothyroxine   50 mcg Oral Q T,Th,S,Su   memantine   5 mg Oral BID   senna  1 tablet Oral BID   sertraline   150 mg Oral Daily   Continuous Infusions:  sodium chloride      sodium chloride  Stopped (02/10/24 0205)     LOS: 2 days    Time spent: 35 mins    Magdalene School, MD [facility]   If 7PM-7AM, please contact night-coverage

## 2024-02-11 NOTE — TOC Progression Note (Signed)
 Transition of Care [facility]) - Progression Note    Patient Details  Name: Tasha Scott MRN: [id number] Date of Birth: May 24, 1936  Transition of Care [facility]) CM/SW Contact  Katrine Parody, LCSW Phone Number: 02/11/2024, 12:55 PM  Clinical Narrative:    CSW received a call from the MD- PT suggested pt may be able to return home with support as an option as she is doing well.  CW agreed to meet with pt.  Met with pt she was in chair finishing lunch. She is not confident about returning home- wants SNF first and gave consent to call daughter- [place] stay when DC. CSW called daughter who agrees that she cannot stay with pt and assist now but possibly after SNF placement- plan remains DC to SNF when ready. Preferred facility is [facility]- request pending.  [place] to MD advising him of the above.  TOC to follow.    Expected Discharge Plan: Skilled Nursing Facility Barriers to Discharge: Continued Medical Work up  Expected Discharge Plan and Services       Living arrangements for the past 2 months: Single Family Home                                       Social Determinants of Health (SDOH) Interventions SDOH Screenings   Food Insecurity: No Food Insecurity (02/09/2024)  Housing: Low Risk  (02/09/2024)  Transportation Needs: No Transportation Needs (02/09/2024)  Utilities: Not At Risk (02/09/2024)  Social Connections: Socially Isolated (02/09/2024)  Tobacco Use: Unknown (02/09/2024)    Readmission Risk Interventions    02/10/2024    2:30 PM 02/09/2024    9:29 AM  Readmission Risk Prevention Plan  Post Dischage Appt Complete Complete  Medication Screening Complete Complete  Transportation Screening Complete Complete

## 2024-02-11 NOTE — Progress Notes (Signed)
    Subjective:  Patient reports pain as mild to moderate.  Denies N/V/CP/SOB/Abd pain. She denies any tingling or numbness in LE bilaterally. She reports her pain is doing okay.  Objective:   VITALS:   Vitals:   02/10/24 1404 02/10/24 1758 02/10/24 2027 02/11/24 0631  BP: 114/83 (!) 144/47 (!) 151/55 (!) 134/59  Pulse: 68 66 75 80  Resp: 17 17 18 17   Temp: 98.7 F (37.1 C) 98.2 F (36.8 C) 99.3 F (37.4 C) 99.1 F (37.3 C)  TempSrc: Oral  Oral Oral  SpO2: 96% 94% 96% 96%  Weight:      Height:        NAD Neurologically intact ABD soft Neurovascular intact Sensation intact distally Intact pulses distally Dorsiflexion/Plantar flexion intact Incision: dressing C/D/I No cellulitis present Compartment soft   Lab Results  Component Value Date   WBC 8.7 02/09/2024   HGB 11.5 (L) 02/09/2024   HCT 35.6 (L) 02/09/2024   MCV 93.9 02/09/2024   PLT 210 02/09/2024   BMET    Component Value Date/Time   NA 141 02/09/2024 0500   K 3.7 02/09/2024 0500   CL 107 02/09/2024 0500   CO2 25 02/09/2024 0500   GLUCOSE 112 (H) 02/09/2024 0500   BUN 22 02/09/2024 0500   CREATININE 0.87 02/09/2024 0500   CALCIUM  9.0 02/09/2024 0500   GFRNONAA >60 02/09/2024 0500     Assessment/Plan: 2 Days Post-Op   Principal Problem:   Closed displaced fracture of right femoral neck (HCC) Active Problems:   Hypothyroidism   Mild cognitive impairment   Fall at home, initial encounter   Essential hypertension   History of depression   WBAT with walker DVT ppx: Aspirin , SCDs, TEDS PO pain control PT/OT: To come today.  Dispo:  - D/C to home vs SNF depending on progress with PT. Pain medication and DVT ppx printed in chart.    Margart Shears Taylor Spilde 02/11/2024, 8:47 AM   [facility]  [place] [address], [address], [place], [place] [address] Phone: [phone] [email or web address] Facebook  [employer]

## 2024-02-11 NOTE — Progress Notes (Signed)
 Physical Therapy Treatment Patient Details Name: Tasha Scott MRN: [id number] DOB: 04-24-1936 Today's Date: 02/11/2024   History of Present Illness Tasha Scott is a 88 y.o. female presents after fall at home resulting in displaced R femoral neck fracture; s/p R THA AA 02/09/24. PMH: prediabetes, hyperlipidemia, hypothyroidism, mild cognitive impairment and depressive disorder    PT Comments  Pt sleeping on arrival and agreeable to mobilize.  Pt ambulated short distance in hallway limited by pain.  Pt currently requiring min assist for mobilizing and presented from home alone after fall resulting in fracture.  Pt could potentially return home at d/c however would need assist as recommended below for safety.  If assist is not available, continue to recommend SNF upon d/c.     If plan is discharge home, recommend the following: A little help with walking and/or transfers;A little help with bathing/dressing/bathroom;Assistance with cooking/housework;Assist for transportation   Can travel by private vehicle     Yes  Equipment Recommendations  Rolling walker (2 wheels)    Recommendations for Other Services       Precautions / Restrictions Precautions Precautions: Fall Restrictions RLE Weight Bearing Per Provider Order: Weight bearing as tolerated     Mobility  Bed Mobility Overal bed mobility: Needs Assistance Bed Mobility: Supine to Sit     Supine to sit: Supervision, HOB elevated, Used rails     General bed mobility comments: increased time and effort due to pain, cues for technique    Transfers Overall transfer level: Needs assistance Equipment used: Rolling walker (2 wheels) Transfers: Sit to/from Stand Sit to Stand: Min assist           General transfer comment: verbal cues for UE and LE positioning, assist to rise and steady as well as control descent    Ambulation/Gait Ambulation/Gait assistance: Contact guard assist Gait Distance (Feet): 50  Feet Assistive device: Rolling walker (2 wheels) Gait Pattern/deviations: Step-to pattern, Decreased weight shift to right, Decreased stance time - right Gait velocity: decreased     General Gait Details: verbal cues for sequence, RW positioning, step length; pt reports moderate pain with activity and self limited distance   Stairs             Wheelchair Mobility     Tilt Bed    Modified Rankin (Stroke Patients Only)       Balance Overall balance assessment: Needs assistance         Standing balance support: Reliant on assistive device for balance, During functional activity, Bilateral upper extremity supported Standing balance-Leahy Scale: Poor                              Communication Communication Communication: No apparent difficulties  Cognition Arousal: Alert Behavior During Therapy: WFL for tasks assessed/performed   PT - Cognitive impairments: No apparent impairments                         Following commands: Intact      Cueing    Exercises      General Comments        Pertinent Vitals/Pain Pain Assessment Pain Assessment: Faces Faces Pain Scale: Hurts little more Pain Location: R hip Pain Descriptors / Indicators: Sore, Aching, Guarding, Grimacing Pain Intervention(s): Monitored during session, Repositioned, RN gave pain meds during session    Home Living  Prior Function            PT Goals (current goals can now be found in the care plan section) Progress towards PT goals: Progressing toward goals    Frequency    Min 3X/week      PT Plan      Co-evaluation              AM-PAC PT "6 Clicks" Mobility   Outcome Measure  Help needed turning from your back to your side while in a flat bed without using bedrails?: A Little Help needed moving from lying on your back to sitting on the side of a flat bed without using bedrails?: A Little Help needed moving to  and from a bed to a chair (including a wheelchair)?: A Little Help needed standing up from a chair using your arms (e.g., wheelchair or bedside chair)?: A Little Help needed to walk in hospital room?: A Little Help needed climbing 3-5 steps with a railing? : A Lot 6 Click Score: 17    End of Session Equipment Utilized During Treatment: Gait belt Activity Tolerance: Patient tolerated treatment well Patient left: in chair;with call bell/phone within reach;with chair alarm set;with nursing/sitter in room   PT Visit Diagnosis: Unsteadiness on feet (R26.81);Other abnormalities of gait and mobility (R26.89);Pain Pain - Right/Left: Left Pain - part of body: Hip     Time: 0953-1005 PT Time Calculation (min) (ACUTE ONLY): 12 min  Charges:    $Gait Training: 8-22 mins PT General Charges $$ ACUTE PT VISIT: 1 Visit                     Blanch Bunde, DPT Physical Therapist Acute Rehabilitation Services Office: [phone]    Kati L Payson 02/11/2024, 10:59 AM

## 2024-02-11 NOTE — Plan of Care (Signed)
  Problem: Coping: Goal: Level of anxiety will decrease Outcome: Progressing   Problem: Elimination: Goal: Will not experience complications related to bowel motility Outcome: Progressing Goal: Will not experience complications related to urinary retention Outcome: Progressing   Problem: Pain Managment: Goal: General experience of comfort will improve and/or be controlled Outcome: Progressing

## 2024-02-12 DIAGNOSIS — S72001A Fracture of unspecified part of neck of right femur, initial encounter for closed fracture: Secondary | ICD-10-CM | POA: Diagnosis not present

## 2024-02-12 MED ORDER — AMLODIPINE BESYLATE 5 MG PO TABS
5.0000 mg | ORAL_TABLET | Freq: Every day | ORAL | Status: DC
  Administered 2024-02-12 – 2024-02-14 (×3): 5 mg via ORAL
  Filled 2024-02-12 (×3): qty 1

## 2024-02-12 NOTE — TOC Progression Note (Addendum)
 Transition of Care [facility]) - Progression Note    Patient Details  Name: Tasha Scott MRN: [id number] Date of Birth: 03-14-1936  Transition of Care [facility]) CM/SW Contact  Amaryllis Junior, LCSW Phone Number: 02/12/2024, 2:28 PM  Clinical Narrative:    CSW reviewed bed offers with pt and pt dtr. Bed choice is [facility]. CSW texted Soy at [facility] to inform of bed acceptance. Level 2 PASRR still pending. Ins auth for SNF and PTAR started; pending approval.   Expected Discharge Plan: Skilled Nursing Facility Barriers to Discharge: Continued Medical Work up  Expected Discharge Plan and Services       Living arrangements for the past 2 months: Single Family Home                                       Social Determinants of Health (SDOH) Interventions SDOH Screenings   Food Insecurity: No Food Insecurity (02/09/2024)  Housing: Low Risk  (02/09/2024)  Transportation Needs: No Transportation Needs (02/09/2024)  Utilities: Not At Risk (02/09/2024)  Social Connections: Socially Isolated (02/09/2024)  Tobacco Use: Unknown (02/09/2024)    Readmission Risk Interventions    02/10/2024    2:30 PM 02/09/2024    9:29 AM  Readmission Risk Prevention Plan  Post Dischage Appt Complete Complete  Medication Screening Complete Complete  Transportation Screening Complete Complete

## 2024-02-12 NOTE — Progress Notes (Signed)
 PROGRESS NOTE    Tasha Scott  ZOX:[id number] DOB: Oct 09, 1935 DOA: 02/09/2024 PCP: Aida House, MD   Brief Narrative:  This 88 years old female with PMH significant for prediabetes, hyperlipidemia, hypothyroidism, mild cognitive impairment and depressive disorder presented in the ED status post mechanical fall after slipping on her socks, She landed on her right side.  She was unable to stand,  complaining about having right-sided hip pain and right elbow pain.  She denies any head injury or loss of consciousness.  In the ED, She is found to have acute displaced impacted subcapital femoral neck fracture on the right femur.  CT lumbar spine,  C-spine and CT head unremarkable. Patient was admitted for further evaluation.    Assessment & Plan:   Principal Problem:   Closed displaced fracture of right femoral neck (HCC) Active Problems:   Fall at home, initial encounter   Hypothyroidism   Mild cognitive impairment   Essential hypertension   History of depression  Acute displaced right femoral neck fracture status post mechanical fall: Patient presented status post mechanical fall on her sock.  She landed on her right side. CT head, CT cervical spine and CT lumbar spine unremarkable. CT pelvis showed acute displaced impacted subcapital femoral neck fracture of the right femur. Continue adequate pain control. Orthopedic surgeon Dr. Charol Copas has been consulted by ED physician Dr. Maralee Senate.   Patient is status post Right total hip arthroplasty anterior approach.  Postoperative day 3. PT and OT recommended skilled nursing facility for rehab.   Hypothyroidism: Continue levothyroxine  50 mcg TTS schedule and 75 mcg MWF schedule   Essential hypertension: Not on any antihypertensive medication.  Continue hydralazine as needed. Start amlodipine 5 mg daily.   Mild cognitive impairment Continue memantine  twice daily.   History of depression: Continue Zoloft  and Atarax  as needed.    Hyperlipidemia: Can restart Lipitor postsurgery.   DVT prophylaxis: SCDs Code Status: Full code Family Communication: No family at bedside. Disposition Plan:     Status is: Inpatient Remains inpatient appropriate because: Admitted for right hip fracture s/p ORIF POD # 3.   Consultants:  Orthopedics  Procedures: ORIF Antimicrobials: None  Subjective: Patient was seen and examined at bedside. Overnight events noted.   Patient reports pain is reasonably controlled.   She is awaiting SNF placement insurance authorization.   Objective: Vitals:   02/11/24 0631 02/11/24 1306 02/11/24 2058 02/12/24 0637  BP: (!) 134/59 (!) 140/64 (!) 145/69 (!) 156/80  Pulse: 80 84 76 73  Resp: 17 16 18 18   Temp: 99.1 F (37.3 C) 99.2 F (37.3 C) 98.1 F (36.7 C) 97.9 F (36.6 C)  TempSrc: Oral Oral Oral   SpO2: 96%  94% 94%  Weight:      Height:        Intake/Output Summary (Last 24 hours) at 02/12/2024 1050 Last data filed at 02/12/2024 0842 Gross per 24 hour  Intake 820 ml  Output --  Net 820 ml   Filed Weights   02/09/24 0608  Weight: 82 kg    Examination:  General exam: Appears calm and comfortable,  not in any acute distress. Respiratory system: CTA bilaterally. Respiratory effort normal.  RR 14 Cardiovascular system: S1 & S2 heard, RRR. No JVD, murmurs, rubs, gallops or clicks.  Gastrointestinal system: Abdomen is non distended, soft and non tender.  Normal bowel sounds heard. Central nervous system: Alert and oriented X 3. No focal neurological deficits. Extremities: Status post right hip ORIF POD # 3  Skin: No rashes, lesions or ulcers Psychiatry: Judgement and insight appear normal. Mood & affect appropriate.     Data Reviewed: I have personally reviewed following labs and imaging studies  CBC: Recent Labs  Lab 02/09/24 0341 02/09/24 0356 02/09/24 0500  WBC 10.0  --  8.7  NEUTROABS 8.4*  --   --   HGB 12.5 12.2 11.5*  HCT 37.3 36.0 35.6*  MCV 91.4  --   93.9  PLT 228  --  210   Basic Metabolic Panel: Recent Labs  Lab 02/09/24 0356 02/09/24 0500  NA 142 141  K 3.8 3.7  CL 107 107  CO2  --  25  GLUCOSE 100* 112*  BUN 24* 22  CREATININE 1.00 0.87  CALCIUM   --  9.0   GFR: Estimated Creatinine Clearance: 49.1 mL/min (by C-G formula based on SCr of 0.87 mg/dL). Liver Function Tests: Recent Labs  Lab 02/09/24 0500  AST 31  ALT 23  ALKPHOS 46  BILITOT 0.6  PROT 6.4*  ALBUMIN 3.7   No results for input(s): "LIPASE", "AMYLASE" in the last 168 hours. No results for input(s): "AMMONIA" in the last 168 hours. Coagulation Profile: No results for input(s): "INR", "PROTIME" in the last 168 hours. Cardiac Enzymes: No results for input(s): "CKTOTAL", "CKMB", "CKMBINDEX", "TROPONINI" in the last 168 hours. BNP (last 3 results) No results for input(s): "PROBNP" in the last 8760 hours. HbA1C: No results for input(s): "HGBA1C" in the last 72 hours. CBG: No results for input(s): "GLUCAP" in the last 168 hours. Lipid Profile: No results for input(s): "CHOL", "HDL", "LDLCALC", "TRIG", "CHOLHDL", "LDLDIRECT" in the last 72 hours. Thyroid  Function Tests: No results for input(s): "TSH", "T4TOTAL", "FREET4", "T3FREE", "THYROIDAB" in the last 72 hours. Anemia Panel: No results for input(s): "VITAMINB12", "FOLATE", "FERRITIN", "TIBC", "IRON", "RETICCTPCT" in the last 72 hours. Sepsis Labs: No results for input(s): "PROCALCITON", "LATICACIDVEN" in the last 168 hours.  Recent Results (from the past 240 hours)  Surgical PCR screen     Status: None   Collection Time: 02/09/24  6:52 AM   Specimen: Nasal Mucosa; Nasal Swab  Result Value Ref Range Status   MRSA, PCR NEGATIVE NEGATIVE Final   Staphylococcus aureus NEGATIVE NEGATIVE Final    Comment: (NOTE) The Xpert SA Assay (FDA approved for NASAL specimens in patients 46 years of age and older), is one component of a comprehensive surveillance program. It is not intended to diagnose  infection nor to guide or monitor treatment. Performed at [facility], [address], [place], [place] [address]     Radiology Studies: No results found.  Scheduled Meds:  aspirin   81 mg Oral BID   docusate sodium   100 mg Oral BID   levothyroxine   75 mcg Oral Q M,W,F   And   levothyroxine   50 mcg Oral Q T,Th,S,Su   memantine   5 mg Oral BID   senna  1 tablet Oral BID   sertraline   150 mg Oral Daily   Continuous Infusions:  sodium chloride      sodium chloride  Stopped (02/10/24 0205)     LOS: 3 days    Time spent: 35 mins    Magdalene School, MD [facility]   If 7PM-7AM, please contact night-coverage

## 2024-02-12 NOTE — Plan of Care (Signed)
?  Problem: Health Behavior/Discharge Planning: ?Goal: Ability to manage health-related needs will improve ?Outcome: Progressing ?  ?Problem: Clinical Measurements: ?Goal: Ability to maintain clinical measurements within normal limits will improve ?Outcome: Progressing ?Goal: Will remain free from infection ?Outcome: Progressing ?Goal: Diagnostic test results will improve ?Outcome: Progressing ?  ?Problem: Activity: ?Goal: Risk for activity intolerance will decrease ?Outcome: Progressing ?  ?

## 2024-02-13 DIAGNOSIS — S72001A Fracture of unspecified part of neck of right femur, initial encounter for closed fracture: Secondary | ICD-10-CM | POA: Diagnosis not present

## 2024-02-13 LAB — CBC
HCT: 31.3 % — ABNORMAL LOW (ref 36.0–46.0)
Hemoglobin: 10.2 g/dL — ABNORMAL LOW (ref 12.0–15.0)
MCH: 30.5 pg (ref 26.0–34.0)
MCHC: 32.6 g/dL (ref 30.0–36.0)
MCV: 93.7 fL (ref 80.0–100.0)
Platelets: 202 10*3/uL (ref 150–400)
RBC: 3.34 MIL/uL — ABNORMAL LOW (ref 3.87–5.11)
RDW: 13.6 % (ref 11.5–15.5)
WBC: 5.3 10*3/uL (ref 4.0–10.5)
nRBC: 0 % (ref 0.0–0.2)

## 2024-02-13 LAB — BASIC METABOLIC PANEL WITH GFR
Anion gap: 10 (ref 5–15)
BUN: 16 mg/dL (ref 8–23)
CO2: 23 mmol/L (ref 22–32)
Calcium: 8.2 mg/dL — ABNORMAL LOW (ref 8.9–10.3)
Chloride: 105 mmol/L (ref 98–111)
Creatinine, Ser: 1.06 mg/dL — ABNORMAL HIGH (ref 0.44–1.00)
GFR, Estimated: 51 mL/min — ABNORMAL LOW (ref >60)
Glucose, Bld: 91 mg/dL (ref 70–99)
Potassium: 3.7 mmol/L (ref 3.5–5.1)
Sodium: 138 mmol/L (ref 135–145)

## 2024-02-13 LAB — MAGNESIUM: Magnesium: 2 mg/dL (ref 1.7–2.4)

## 2024-02-13 LAB — PHOSPHORUS: Phosphorus: 2.6 mg/dL (ref 2.5–4.6)

## 2024-02-13 MED ORDER — AMLODIPINE BESYLATE 5 MG PO TABS: 5.0000 mg | ORAL_TABLET | Freq: Every day | ORAL | 1 refills | Status: AC

## 2024-02-13 MED ORDER — METHOCARBAMOL 500 MG PO TABS: 500.0000 mg | ORAL_TABLET | Freq: Four times a day (QID) | ORAL | 0 refills | Status: AC | PRN

## 2024-02-13 NOTE — Discharge Summary (Signed)
 Physician Discharge Summary  Tasha Scott RUE:[id number] DOB: September 16, 1936 DOA: 02/09/2024  PCP: Aida House, MD  Admit date: 02/09/2024  Discharge date: 02/14/2024  Admitted From: Home.  Disposition:  SNF([facility])  Recommendations for Outpatient Follow-up:  Follow up with PCP in 1-2 weeks. Please obtain BMP/CBC in one week. Advised to follow-up with orthopedics as scheduled. Patient is being discharged to skilled nursing facility for rehab.   Patient is status post ORIF for Right hip fracture.  Home Health:None Equipment/Devices:None  Discharge Condition: Stable CODE STATUS:Full code Diet recommendation: Heart Healthy   Brief [facility] Course: This 88 years old female with PMH significant for prediabetes, hyperlipidemia, hypothyroidism, mild cognitive impairment and depressive disorder presented in the ED status post mechanical fall after slipping on her socks, She landed on her right side. She was unable to stand, complaining about having right-sided hip pain and right elbow pain. She denies any head injury or loss of consciousness. In the ED, She is found to have acute displaced impacted subcapital femoral neck fracture on the right femur. CT lumbar spine, C-spine and CT head unremarkable. Patient was admitted for further evaluation.  Status post ORIF.  Postoperative day 4. Patient participated in physical therapy,  Pain is reasonably controlled,  orthopedics signed off.  Patient is being discharged to skilled nursing facility for rehab.  Patient will follow-up orthopedics in 2 weeks.  Discharge Diagnoses:  Principal Problem:   Closed displaced fracture of right femoral neck (HCC) Active Problems:   Fall at home, initial encounter   Hypothyroidism   Mild cognitive impairment   Essential hypertension   History of depression  Acute displaced right femoral neck fracture status post mechanical fall: Patient presented status post mechanical fall on her sock.  She  landed on her right side. CT head, CT cervical spine and CT lumbar spine unremarkable. CT pelvis showed acute displaced impacted subcapital femoral neck fracture of the right femur. Continue adequate pain control. Orthopedic surgeon Dr. Charol Copas has been consulted by ED physician Dr. Maralee Senate.   Patient is status post Right total hip arthroplasty anterior approach.  Postoperative day 4. PT and OT recommended skilled nursing facility for rehab. Insurance authorization approved.  Patient being discharged to SNF for rehab.   Hypothyroidism: Continue levothyroxine  50 mcg TTS schedule and 75 mcg MWF schedule.   Essential hypertension: Not on any antihypertensive medication.  Continue hydralazine as needed. Start amlodipine 5 mg daily.   Mild cognitive impairment Continue memantine  twice daily.   History of depression: Continue Zoloft  and Atarax  as needed.   Hyperlipidemia: Can restart Lipitor postsurgery.    Discharge Instructions  Discharge Instructions     Call MD for:  difficulty breathing, headache or visual disturbances   Complete by: As directed    Call MD for:  persistant dizziness or light-headedness   Complete by: As directed    Call MD for:  persistant nausea and vomiting   Complete by: As directed    Diet - low sodium heart healthy   Complete by: As directed    Diet Carb Modified   Complete by: As directed    Discharge instructions   Complete by: As directed    Advised to follow-up with primary care physician in 1 week. Advised to follow-up with orthopedics as scheduled. Patient is being discharged to skilled nursing facility for rehab.   Patient is status post ORIF for left hip fracture.   Increase activity slowly   Complete by: As directed  Allergies as of 02/14/2024       Reactions   Codeine    Hallucinations, dizziness        Medication List     STOP taking these medications    Diclofenac Sodium CR 100 MG 24 hr tablet       TAKE  these medications    amLODipine 5 MG tablet Commonly known as: NORVASC Take 1 tablet (5 mg total) by mouth daily.   aspirin  81 MG chewable tablet Commonly known as: Aspirin  Childrens Chew 1 tablet (81 mg total) by mouth 2 (two) times daily with a meal.   atorvastatin  20 MG tablet Commonly known as: LIPITOR Take 1 tablet (20 mg total) by mouth daily.   b complex vitamins capsule Take 1 capsule by mouth daily.   HYDROcodone-acetaminophen  5-325 MG tablet Commonly known as: NORCO/VICODIN Take 1 tablet by mouth every 4 (four) hours as needed for up to 7 days for moderate pain (pain score 4-6) or severe pain (pain score 7-10).   JUICE PLUS FIBRE PO Take 2 capsules by mouth in the morning and at bedtime.   memantine  5 MG tablet Commonly known as: NAMENDA  Take 1 tablet (5 mg total) by mouth daily for 30 days, THEN 1 tablet (5 mg total) 2 (two) times daily. Start taking on: November 28, 2023   methocarbamol  500 MG tablet Commonly known as: ROBAXIN  Take 1 tablet (500 mg total) by mouth every 6 (six) hours as needed for up to 10 days for muscle spasms.   mirtazapine 15 MG tablet Commonly known as: REMERON Take 15 mg by mouth at bedtime.   PROBIOTIC-10 PO Take 1 capsule by mouth daily.   sertraline  100 MG tablet Commonly known as: ZOLOFT  Take 1.5 tablets (150 mg total) by mouth daily.   Synthroid  75 MCG tablet Generic drug: levothyroxine  Take 1 tablet (75 mcg total) by mouth every Monday, Wednesday, and Friday.   Synthroid  50 MCG tablet Generic drug: levothyroxine  Take 1 tablet (50 mcg total) by mouth every Tuesday, Thursday, Saturday, and Sunday.   Vitamin D3 125 MCG (5000 UT) Caps Take 1 capsule by mouth daily.   Womens Multivitamin Tabs Take 1 tablet by mouth daily.        Contact information for follow-up providers     [place], Philippa Bray, PA-C. Schedule an appointment as soon as possible for a visit in 2 week(s).   Specialty: Orthopedic Surgery Why: For wound  re-check Contact information: [address] [place] [address] [phone]         Aida House, MD Follow up in 1 week(s).   Specialty: Family Medicine Contact information: [address] [place] [address] [phone]              Contact information for after-discharge care     Destination     [facility] .   Service: Skilled Nursing Contact information: [address] [place]  [address] [phone]                    Allergies  Allergen Reactions   Codeine     Hallucinations, dizziness    Consultations: Orthopeadics   Procedures/Studies: DG HIP UNILAT W OR W/O PELVIS 2-3 VIEWS RIGHT Result Date: 02/09/2024 CLINICAL DATA:  Postoperative evaluation. EXAM: DG HIP (WITH OR WITHOUT PELVIS) 2-3V RIGHT COMPARISON:  Same day radiographs of the right hip dated 02/09/2024 at 5:52 a.m. FINDINGS: Status post right total hip arthroplasty with appropriate  alignment. No acute fracture or dislocation. Sacroiliac joints and pubic symphysis are anatomically aligned. Mild degenerative changes of the left hip. Expected postoperative soft tissue swelling, soft tissue air, and cutaneous staples along the right hip. IMPRESSION: Expected postoperative changes status post right total hip arthroplasty with appropriate alignment. No acute complication. Electronically Signed   By: Mannie Seek M.D.   On: 02/09/2024 17:31   DG HIP UNILAT WITH PELVIS 1V RIGHT Result Date: 02/09/2024 CLINICAL DATA:  Right total hip arthroplasty. EXAM: DG HIP (WITH OR WITHOUT PELVIS) 1V RIGHT COMPARISON:  Right hip radiograph dated 02/09/2024 at 5:52 a.m. FINDINGS: Images were performed intraoperatively without the presence of a radiologist. Interval right total hip arthroplasty. Normal alignment. Total fluoroscopy images: 2 Total fluoroscopy time: 12 seconds Total dose: Radiation Exposure Index (as provided by the fluoroscopic device):  1.56 mGy air Kerma Please see intraoperative findings for further detail. IMPRESSION: Intraoperative fluoroscopic spot images of interval right total hip arthroplasty. Normal alignment. Electronically Signed   By: Mannie Seek M.D.   On: 02/09/2024 15:27   DG C-Arm 1-60 Min-No Report Result Date: 02/09/2024 Fluoroscopy was utilized by the requesting physician.  No radiographic interpretation.   DG C-Arm 1-60 Min-No Report Result Date: 02/09/2024 Fluoroscopy was utilized by the requesting physician.  No radiographic interpretation.   DG Knee Right Port Result Date: 02/09/2024 CLINICAL DATA:  Right femoral neck fracture. EXAM: PORTABLE RIGHT KNEE - 1-2 VIEW COMPARISON:  Right hip radiograph dated 02/09/2024. FINDINGS: There is a total right knee arthroplasty. The arthroplasty components appear intact and in anatomic alignment. There is no acute fracture or dislocation. The bones are well mineralized. No significant joint effusion. The soft tissues are unremarkable. IMPRESSION: 1. No acute fracture or dislocation. 2. Total right knee arthroplasty. Electronically Signed   By: Angus Bark M.D.   On: 02/09/2024 11:05   DG HIP UNILAT W OR W/O PELVIS 2-3 VIEWS RIGHT Result Date: 02/09/2024 CLINICAL DATA:  Preop for hip fracture EXAM: DG HIP (WITH OR WITHOUT PELVIS) 3V RIGHT COMPARISON:  CT from earlier today FINDINGS: Subcapital right femoral neck fracture with displacement. Mild degenerative spurring at the right hip. No evidence of pelvic ring fracture. Elongated soft tissue calcification over the left pelvis is in the subcutaneous gluteal fat by CT. IMPRESSION: Displaced right femoral neck fracture. Mild right hip degenerative spurring. Electronically Signed   By: Ronnette Coke M.D.   On: 02/09/2024 06:16   CT PELVIS WO CONTRAST Result Date: 02/09/2024 CLINICAL DATA:  Pelvic fracture. Fell onto right side when putting on socks. EXAM: CT PELVIS WITHOUT CONTRAST TECHNIQUE: Multidetector CT imaging of  the pelvis was performed following the standard protocol without intravenous contrast. RADIATION DOSE REDUCTION: This exam was performed according to the departmental dose-optimization program which includes automated exposure control, adjustment of the mA and/or kV according to patient size and/or use of iterative reconstruction technique. COMPARISON:  CT abdomen pelvis 03/06/2022 FINDINGS: Urinary Tract:  No abnormality visualized. Bowel:  Unremarkable visualized pelvic bowel loops. Vascular/Lymphatic: No pathologically enlarged lymph nodes. No significant vascular abnormality seen. Reproductive:  No mass or other significant abnormality Other:  None. Musculoskeletal: Acute mildly displaced mildly impacted subcapital femoral neck fracture of the right femur. The right femoral shaft is displaced superiorly. No dislocation. IMPRESSION: Acute displaced impacted subcapital femoral neck fracture of the right femur. Electronically Signed   By: Rozell Cornet M.D.   On: 02/09/2024 03:46   CT Head Wo Contrast Result Date: 02/09/2024 CLINICAL DATA:  Polytrauma, blunt EXAM:  CT HEAD WITHOUT CONTRAST CT CERVICAL SPINE WITHOUT CONTRAST TECHNIQUE: Multidetector CT imaging of the head and cervical spine was performed following the standard protocol without intravenous contrast. Multiplanar CT image reconstructions of the cervical spine were also generated. RADIATION DOSE REDUCTION: This exam was performed according to the departmental dose-optimization program which includes automated exposure control, adjustment of the mA and/or kV according to patient size and/or use of iterative reconstruction technique. COMPARISON:  None Available. FINDINGS: CT HEAD FINDINGS Brain: No evidence of acute infarction, hemorrhage, hydrocephalus, extra-axial collection or mass lesion/mass effect. Vascular: No hyperdense vessel identified. Skull: No acute fracture. Sinuses/Orbits: No acute finding. Other: No mastoid effusions. CT CERVICAL SPINE  FINDINGS Alignment: Reversal of the normal cervical lordosis. No substantial sagittal subluxation. Skull base and vertebrae: No acute fracture. Soft tissues and spinal canal: No prevertebral fluid or swelling. No visible canal hematoma. Disc levels: Degenerative disc disease, greatest on the right at C4-C5 where there is disc height loss and endplate spurring. Upper chest: Visualized lung apices are clear. IMPRESSION: 1. No evidence of acute intracranial abnormality. 2. No evidence of acute fracture or traumatic malalignment in the cervical spine. Electronically Signed   By: Stevenson Elbe M.D.   On: 02/09/2024 03:45   CT Cervical Spine Wo Contrast Result Date: 02/09/2024 CLINICAL DATA:  Polytrauma, blunt EXAM: CT HEAD WITHOUT CONTRAST CT CERVICAL SPINE WITHOUT CONTRAST TECHNIQUE: Multidetector CT imaging of the head and cervical spine was performed following the standard protocol without intravenous contrast. Multiplanar CT image reconstructions of the cervical spine were also generated. RADIATION DOSE REDUCTION: This exam was performed according to the departmental dose-optimization program which includes automated exposure control, adjustment of the mA and/or kV according to patient size and/or use of iterative reconstruction technique. COMPARISON:  None Available. FINDINGS: CT HEAD FINDINGS Brain: No evidence of acute infarction, hemorrhage, hydrocephalus, extra-axial collection or mass lesion/mass effect. Vascular: No hyperdense vessel identified. Skull: No acute fracture. Sinuses/Orbits: No acute finding. Other: No mastoid effusions. CT CERVICAL SPINE FINDINGS Alignment: Reversal of the normal cervical lordosis. No substantial sagittal subluxation. Skull base and vertebrae: No acute fracture. Soft tissues and spinal canal: No prevertebral fluid or swelling. No visible canal hematoma. Disc levels: Degenerative disc disease, greatest on the right at C4-C5 where there is disc height loss and endplate  spurring. Upper chest: Visualized lung apices are clear. IMPRESSION: 1. No evidence of acute intracranial abnormality. 2. No evidence of acute fracture or traumatic malalignment in the cervical spine. Electronically Signed   By: Stevenson Elbe M.D.   On: 02/09/2024 03:45   CT Lumbar Spine Wo Contrast Result Date: 02/09/2024 CLINICAL DATA:  Marvell Slider onto right side. Right hip pain and elbow pain. EXAM: CT LUMBAR SPINE WITHOUT CONTRAST TECHNIQUE: Multidetector CT imaging of the lumbar spine was performed without intravenous contrast administration. Multiplanar CT image reconstructions were also generated. RADIATION DOSE REDUCTION: This exam was performed according to the departmental dose-optimization program which includes automated exposure control, adjustment of the mA and/or kV according to patient size and/or use of iterative reconstruction technique. COMPARISON:  CT abdomen pelvis 03/06/2022. FINDINGS: Segmentation: 5 lumbar type vertebrae. Alignment: No evidence of traumatic listhesis. Vertebrae: No acute fracture. Paraspinal and other soft tissues: Negative. Disc levels: Multilevel age commensurate spondylosis, disc space height loss, degenerative endplate changes greatest at L2-L3 and L3-L4. Moderate lower lumbar facet arthropathy. IMPRESSION: No evidence of acute fracture or traumatic listhesis. Electronically Signed   By: Rozell Cornet M.D.   On: 02/09/2024 03:44  Subjective: Patient was seen and examined at bedside.Overnight events noted.   Patient reports doing much better,  Pain is reasonably controlled.  She wants to be discharged.   Patient is being discharged to SNF  Discharge Exam: Vitals:   02/13/24 2110 02/14/24 0523  BP: (!) 139/54 (!) 152/71  Pulse: 71 68  Resp: 16 16  Temp: 98.3 F (36.8 C) 98.2 F (36.8 C)  SpO2: 93% 93%   Vitals:   02/13/24 1451 02/13/24 1744 02/13/24 2110 02/14/24 0523  BP: (!) 128/53 129/71 (!) 139/54 (!) 152/71  Pulse: 66 70 71 68  Resp: 16 16  16 16   Temp: 97.9 F (36.6 C)  98.3 F (36.8 C) 98.2 F (36.8 C)  TempSrc:   Oral Oral  SpO2: 97% 91% 93% 93%  Weight:      Height:        General: Pt is alert, awake, not in acute distress Cardiovascular: RRR, S1/S2 +, no rubs, no gallops Respiratory: CTA bilaterally, no wheezing, no rhonchi Abdominal: Soft, NT, ND, bowel sounds + Extremities: no edema, no cyanosis    The results of significant diagnostics from this hospitalization (including imaging, microbiology, ancillary and laboratory) are listed below for reference.     Microbiology: Recent Results (from the past 240 hours)  Surgical PCR screen     Status: None   Collection Time: 02/09/24  6:52 AM   Specimen: Nasal Mucosa; Nasal Swab  Result Value Ref Range Status   MRSA, PCR NEGATIVE NEGATIVE Final   Staphylococcus aureus NEGATIVE NEGATIVE Final    Comment: (NOTE) The Xpert SA Assay (FDA approved for NASAL specimens in patients 27 years of age and older), is one component of a comprehensive surveillance program. It is not intended to diagnose infection nor to guide or monitor treatment. Performed at [facility], [address] [place], [place] [address]      Labs: BNP (last 3 results) No results for input(s): "BNP" in the last 8760 hours. Basic Metabolic Panel: Recent Labs  Lab 02/09/24 0356 02/09/24 0500 02/13/24 0342  NA 142 141 138  K 3.8 3.7 3.7  CL 107 107 105  CO2  --  25 23  GLUCOSE 100* 112* 91  BUN 24* 22 16  CREATININE 1.00 0.87 1.06*  CALCIUM   --  9.0 8.2*  MG  --   --  2.0  PHOS  --   --  2.6   Liver Function Tests: Recent Labs  Lab 02/09/24 0500  AST 31  ALT 23  ALKPHOS 46  BILITOT 0.6  PROT 6.4*  ALBUMIN 3.7   No results for input(s): "LIPASE", "AMYLASE" in the last 168 hours. No results for input(s): "AMMONIA" in the last 168 hours. CBC: Recent Labs  Lab 02/09/24 0341 02/09/24 0356 02/09/24 0500 02/13/24 0342  WBC 10.0  --  8.7 5.3  NEUTROABS  8.4*  --   --   --   HGB 12.5 12.2 11.5* 10.2*  HCT 37.3 36.0 35.6* 31.3*  MCV 91.4  --  93.9 93.7  PLT 228  --  210 202   Cardiac Enzymes: No results for input(s): "CKTOTAL", "CKMB", "CKMBINDEX", "TROPONINI" in the last 168 hours. BNP: Invalid input(s): "POCBNP" CBG: No results for input(s): "GLUCAP" in the last 168 hours. D-Dimer No results for input(s): "DDIMER" in the last 72 hours. Hgb A1c No results for input(s): "HGBA1C" in the last 72 hours. Lipid Profile No results for input(s): "CHOL", "HDL", "LDLCALC", "TRIG", "CHOLHDL", "LDLDIRECT" in the  last 72 hours. Thyroid  function studies No results for input(s): "TSH", "T4TOTAL", "T3FREE", "THYROIDAB" in the last 72 hours.  Invalid input(s): "FREET3" Anemia work up No results for input(s): "VITAMINB12", "FOLATE", "FERRITIN", "TIBC", "IRON", "RETICCTPCT" in the last 72 hours. Urinalysis    Component Value Date/Time   COLORURINE YELLOW 03/06/2022 2131   APPEARANCEUR HAZY (A) 03/06/2022 2131   LABSPEC >1.046 (H) 03/06/2022 2131   PHURINE 5.0 03/06/2022 2131   GLUCOSEU NEGATIVE 03/06/2022 2131   HGBUR NEGATIVE 03/06/2022 2131   BILIRUBINUR NEGATIVE 03/06/2022 2131   KETONESUR NEGATIVE 03/06/2022 2131   PROTEINUR NEGATIVE 03/06/2022 2131   NITRITE NEGATIVE 03/06/2022 2131   LEUKOCYTESUR LARGE (A) 03/06/2022 2131   Sepsis Labs Recent Labs  Lab 02/09/24 0341 02/09/24 0500 02/13/24 0342  WBC 10.0 8.7 5.3   Microbiology Recent Results (from the past 240 hours)  Surgical PCR screen     Status: None   Collection Time: 02/09/24  6:52 AM   Specimen: Nasal Mucosa; Nasal Swab  Result Value Ref Range Status   MRSA, PCR NEGATIVE NEGATIVE Final   Staphylococcus aureus NEGATIVE NEGATIVE Final    Comment: (NOTE) The Xpert SA Assay (FDA approved for NASAL specimens in patients 72 years of age and older), is one component of a comprehensive surveillance program. It is not intended to diagnose infection nor to guide or monitor  treatment. Performed at [facility], [address], [place], [place] [address]      Time coordinating discharge: Over 30 minutes  SIGNED:   Magdalene School, MD  [facility] 02/14/2024, 9:11 AM Pager   If 7PM-7AM, please contact night-coverage

## 2024-02-13 NOTE — TOC Transition Note (Signed)
 Transition of Care [facility]) - Discharge Note   Patient Details  Name: Tasha Scott MRN: [id number] Date of Birth: December 17, 1935  [facility]) CM/SW Contact:  Bari Leys, RN Phone Number: 02/13/2024, 11:50 AM   Clinical Narrative:   DC to [facility]. [facility] for tranport, pt's dtr, [place] notified, will complete SNF admit paperwork. No further TOC needs.      Final next level of care: Skilled Nursing Facility Barriers to Discharge: Continued Medical Work up   Patient Goals and CMS Choice Patient states their goals for this hospitalization and ongoing recovery are:: return home CMS Medicare.gov Compare Post Acute Care list provided to:: Patient Choice offered to / list presented to : Patient [place] ownership interest in [facility].provided to:: Patient    Discharge Placement              Patient chooses bed at: [facility] Patient to be transferred to facility by: [facility] Name of family member notified: Dewayne Ford (Daughter)  [phone] [facility]) Patient and family notified of of transfer: 02/13/24  Discharge Plan and Services Additional resources added to the After Visit Summary for                                       Social Drivers of Health (SDOH) Interventions SDOH Screenings   Food Insecurity: No Food Insecurity (02/09/2024)  Housing: Low Risk  (02/09/2024)  Transportation Needs: No Transportation Needs (02/09/2024)  Utilities: Not At Risk (02/09/2024)  Social Connections: Socially Isolated (02/09/2024)  Tobacco Use: Unknown (02/09/2024)     Readmission Risk Interventions    02/10/2024    2:30 PM 02/09/2024    9:29 AM  Readmission Risk Prevention Plan  Post Dischage Appt Complete Complete  Medication Screening Complete Complete  Transportation Screening Complete Complete

## 2024-02-13 NOTE — Plan of Care (Signed)
   Problem: Coping: Goal: Level of anxiety will decrease Outcome: Progressing   Problem: Pain Managment: Goal: General experience of comfort will improve and/or be controlled Outcome: Progressing   Problem: Safety: Goal: Ability to remain free from injury will improve Outcome: Progressing

## 2024-02-13 NOTE — Progress Notes (Signed)
 Physical Therapy Treatment Patient Details Name: Tasha Scott MRN: [id number] DOB: Dec 06, 1935 Today's Date: 02/13/2024   History of Present Illness Tasha Scott is a 88 y.o. female presents after fall at home resulting in displaced R femoral neck fracture; s/p R THA AA 02/09/24. PMH: prediabetes, hyperlipidemia, hypothyroidism, mild cognitive impairment and depressive disorder    PT Comments  POD # 4 am session PT - Cognition Comments: AxO x 3 pleasant Lady who lives home, was IND/Driving plans to D/C SNF for ST Rehab. Assisted OOB required increased time and effort.  General bed mobility comments: increased time and effort due to pain, cues for technique using belt to guide LE  General transfer comment: verbal cues for UE and LE positioning, assist to rise and steady as well as control descent  General Gait Details: verbal cues for sequence, RW positioning, step length; pt reports moderate pain with activity and self limited distance.  Unsteady with turns. Then returned to room to perform some TE's following HEP handout.  Instructed on proper tech, freq as well as use of ICE.   LPT has rec Pt will need ST Rehab at SNF to address mobility and functional decline prior to safely returning home.    If plan is discharge home, recommend the following: A little help with walking and/or transfers;A little help with bathing/dressing/bathroom;Assistance with cooking/housework;Assist for transportation   Can travel by private vehicle     Yes  Equipment Recommendations  Rolling walker (2 wheels)    Recommendations for Other Services       Precautions / Restrictions Precautions Precautions: Fall Restrictions Weight Bearing Restrictions Per Provider Order: No RLE Weight Bearing Per Provider Order: Weight bearing as tolerated     Mobility  Bed Mobility Overal bed mobility: Needs Assistance Bed Mobility: Supine to Sit     Supine to sit: Min assist     General bed mobility comments:  increased time and effort due to pain, cues for technique using belt to guide LE    Transfers Overall transfer level: Needs assistance Equipment used: Rolling walker (2 wheels) Transfers: Sit to/from Stand Sit to Stand: Min assist           General transfer comment: verbal cues for UE and LE positioning, assist to rise and steady as well as control descent    Ambulation/Gait Ambulation/Gait assistance: Min assist, Contact guard assist Gait Distance (Feet): 42 Feet Assistive device: Rolling walker (2 wheels) Gait Pattern/deviations: Step-to pattern, Decreased weight shift to right, Decreased stance time - right Gait velocity: decreased     General Gait Details: verbal cues for sequence, RW positioning, step length; pt reports moderate pain with activity and self limited distance.  Unsteady with turns.   Stairs             Wheelchair Mobility     Tilt Bed    Modified Rankin (Stroke Patients Only)       Balance                                            Communication Communication Communication: No apparent difficulties  Cognition Arousal: Alert Behavior During Therapy: WFL for tasks assessed/performed   PT - Cognitive impairments: No apparent impairments                       PT - Cognition Comments: AxO x  3 pleasant Lady who lives home, was IND/Driving plans to D/C SNF for ST Rehab. Following commands: Intact      Cueing    Exercises  Total Hip Replacement TE's following HEP Handout 10 reps ankle pumps 05 reps knee presses 05 reps heel slides 05 reps SAQ's 05 reps ABD Instructed how to use a belt loop to assist  Followed by ICE      General Comments        Pertinent Vitals/Pain Pain Assessment Pain Assessment: 0-10 Pain Score: 5  Pain Location: R hip Pain Descriptors / Indicators: Sore, Aching, Guarding, Grimacing Pain Intervention(s): Monitored during session, Premedicated before session, Repositioned, Ice  applied    Home Living                          Prior Function            PT Goals (current goals can now be found in the care plan section) Progress towards PT goals: Progressing toward goals    Frequency    Min 3X/week      PT Plan      Co-evaluation              AM-PAC PT "6 Clicks" Mobility   Outcome Measure  Help needed turning from your back to your side while in a flat bed without using bedrails?: A Lot Help needed moving from lying on your back to sitting on the side of a flat bed without using bedrails?: A Lot Help needed moving to and from a bed to a chair (including a wheelchair)?: A Little Help needed standing up from a chair using your arms (e.g., wheelchair or bedside chair)?: A Little Help needed to walk in hospital room?: A Lot Help needed climbing 3-5 steps with a railing? : A Lot 6 Click Score: 14    End of Session Equipment Utilized During Treatment: Gait belt Activity Tolerance: Patient tolerated treatment well Patient left: in chair;with call bell/phone within reach;with chair alarm set;with nursing/sitter in room Nurse Communication: Mobility status;Other (comment) PT Visit Diagnosis: Unsteadiness on feet (R26.81);Other abnormalities of gait and mobility (R26.89);Pain Pain - Right/Left: Left Pain - part of body: Hip     Time: 6045-4098 PT Time Calculation (min) (ACUTE ONLY): 26 min  Charges:    $Gait Training: 8-22 mins $Therapeutic Exercise: 8-22 mins PT General Charges $$ ACUTE PT VISIT: 1 Visit                     Bess Broody  PTA Acute  Rehabilitation Services Office M-F          [phone]

## 2024-02-13 NOTE — TOC Progression Note (Addendum)
 Transition of Care [facility]) - Progression Note    Patient Details  Name: Tasha Scott MRN: [id number] Date of Birth: 1936/06/03  [facility]) CM/SW Contact  Amaryllis Junior, [place] Phone Number: 02/13/2024, 10:03 AM  Clinical Narrative:    HTA ins auth approved for [facility] #[id number] and [facility] #[id number]. Auth valid through 02/18/24   Expected Discharge Plan: Skilled Nursing Facility Barriers to Discharge: Continued Medical Work up  Expected Discharge Plan and Services       Living arrangements for the past 2 months: Single Family Home                                       Social Determinants of Health (SDOH) Interventions SDOH Screenings   Food Insecurity: No Food Insecurity (02/09/2024)  Housing: Low Risk  (02/09/2024)  Transportation Needs: No Transportation Needs (02/09/2024)  Utilities: Not At Risk (02/09/2024)  Social Connections: Socially Isolated (02/09/2024)  Tobacco Use: Unknown (02/09/2024)    Readmission Risk Interventions    02/10/2024    2:30 PM 02/09/2024    9:29 AM  Readmission Risk Prevention Plan  Post Dischage Appt Complete Complete  Medication Screening Complete Complete  Transportation Screening Complete Complete

## 2024-02-13 NOTE — Plan of Care (Signed)
  Problem: Activity: Goal: Risk for activity intolerance will decrease Outcome: Progressing   Problem: Pain Managment: Goal: General experience of comfort will improve and/or be controlled Outcome: Progressing   Problem: Safety: Goal: Ability to remain free from injury will improve Outcome: Progressing   Problem: Skin Integrity: Goal: Risk for impaired skin integrity will decrease Outcome: Progressing

## 2024-02-13 NOTE — Progress Notes (Signed)
 Physical Therapy Treatment Patient Details Name: Tasha Scott MRN: [id number] DOB: [birth year] Today's Date: 02/13/2024   History of Present Illness Tasha Scott is a 88 y.o. female presents after fall at home resulting in displaced R femoral neck fracture; s/p R THA AA 02/09/24. PMH: prediabetes, hyperlipidemia, hypothyroidism, mild cognitive impairment and depressive disorder    PT Comments  POD # 4 pm session AxO x 3 pleasant Lady who lives home, was IND/Driving when she fell in her home.  plans to D/C SNF for ST Rehab. Pt remains in recliner.  Assisted with amb to bathroom.  General transfer comment: verbal cues for UE and LE positioning, assist to rise and steady as well as control descent.  Also assisted with a toilet transfer. General Gait Details: verbal cues for sequence, RW positioning, step length; pt reports moderate pain with activity and self limited distance.  Unsteady with turns. HIGH FALL RISK.   LPT has rec Pt will need ST Rehab at SNF to address mobility and functional decline prior to safely returning home.    If plan is discharge home, recommend the following: A little help with walking and/or transfers;A little help with bathing/dressing/bathroom;Assistance with cooking/housework;Assist for transportation   Can travel by private vehicle     Yes  Equipment Recommendations  Rolling walker (2 wheels)    Recommendations for Other Services       Precautions / Restrictions Precautions Precautions: Fall Restrictions Weight Bearing Restrictions Per Provider Order: No RLE Weight Bearing Per Provider Order: Weight bearing as tolerated     Mobility  Bed Mobility  Transfers Overall transfer level: Needs assistance Equipment used: Rolling walker (2 wheels) Transfers: Sit to/from Stand Sit to Stand: Min assist           General transfer comment: verbal cues for UE and LE positioning, assist to rise and steady as well as control descent.  Also assisted with a  toilet transfer.    Ambulation/Gait Ambulation/Gait assistance: Min assist, Contact guard assist Gait Distance (Feet): 38 Feet Assistive device: Rolling walker (2 wheels) Gait Pattern/deviations: Step-to pattern, Decreased weight shift to right, Decreased stance time - right Gait velocity: decreased     General Gait Details: verbal cues for sequence, RW positioning, step length; pt reports moderate pain with activity and self limited distance.  Unsteady with turns.   Stairs             Wheelchair Mobility     Tilt Bed    Modified Rankin (Stroke Patients Only)       Balance                                            Communication Communication Communication: No apparent difficulties  Cognition Arousal: Alert Behavior During Therapy: WFL for tasks assessed/performed   PT - Cognitive impairments: No apparent impairments                       PT - Cognition Comments: AxO x 3 pleasant Lady who lives home, was IND/Driving plans to D/C SNF for ST Rehab. Following commands: Intact      Cueing    Exercises      General Comments        Pertinent Vitals/Pain Pain Assessment Pain Assessment: 0-10 Pain Score: 5  Pain Location: R hip Pain Descriptors / Indicators: Sore, Aching, Guarding, Grimacing  Pain Intervention(s): Monitored during session, Premedicated before session, Repositioned, Ice applied    Home Living                          Prior Function            PT Goals (current goals can now be found in the care plan section) Progress towards PT goals: Progressing toward goals    Frequency    Min 3X/week      PT Plan      Co-evaluation              AM-PAC PT "6 Clicks" Mobility   Outcome Measure  Help needed turning from your back to your side while in a flat bed without using bedrails?: A Lot Help needed moving from lying on your back to sitting on the side of a flat bed without using  bedrails?: A Lot Help needed moving to and from a bed to a chair (including a wheelchair)?: A Little Help needed standing up from a chair using your arms (e.g., wheelchair or bedside chair)?: A Little Help needed to walk in hospital room?: A Lot Help needed climbing 3-5 steps with a railing? : A Lot 6 Click Score: 14    End of Session Equipment Utilized During Treatment: Gait belt Activity Tolerance: Patient tolerated treatment well Patient left: in chair;with call bell/phone within reach;with chair alarm set;with nursing/sitter in room Nurse Communication: Mobility status;Other (comment) PT Visit Diagnosis: Unsteadiness on feet (R26.81);Other abnormalities of gait and mobility (R26.89);Pain Pain - Right/Left: Left Pain - part of body: Hip     Time: 8295-6213 PT Time Calculation (min) (ACUTE ONLY): 24 min  Charges:    $Gait Training: 8-22 mins $Therapeutic Activity: 8-22 mins PT General Charges $$ ACUTE PT VISIT: 1 Visit                     {Hephzibah Strehle  PTA Acute  Rehabilitation Services Office M-F          [phone]

## 2024-02-14 DIAGNOSIS — I129 Hypertensive chronic kidney disease with stage 1 through stage 4 chronic kidney disease, or unspecified chronic kidney disease: Secondary | ICD-10-CM | POA: Diagnosis not present

## 2024-02-14 DIAGNOSIS — M62838 Other muscle spasm: Secondary | ICD-10-CM | POA: Diagnosis not present

## 2024-02-14 DIAGNOSIS — Z28311 Partially vaccinated for covid-19: Secondary | ICD-10-CM | POA: Diagnosis not present

## 2024-02-14 DIAGNOSIS — H9313 Tinnitus, bilateral: Secondary | ICD-10-CM | POA: Diagnosis not present

## 2024-02-14 DIAGNOSIS — M25521 Pain in right elbow: Secondary | ICD-10-CM | POA: Diagnosis not present

## 2024-02-14 DIAGNOSIS — F32A Depression, unspecified: Secondary | ICD-10-CM | POA: Diagnosis not present

## 2024-02-14 DIAGNOSIS — N1831 Chronic kidney disease, stage 3a: Secondary | ICD-10-CM | POA: Diagnosis not present

## 2024-02-14 DIAGNOSIS — K219 Gastro-esophageal reflux disease without esophagitis: Secondary | ICD-10-CM | POA: Diagnosis not present

## 2024-02-14 DIAGNOSIS — Z471 Aftercare following joint replacement surgery: Secondary | ICD-10-CM | POA: Diagnosis not present

## 2024-02-14 DIAGNOSIS — Z9181 History of falling: Secondary | ICD-10-CM | POA: Diagnosis not present

## 2024-02-14 DIAGNOSIS — E559 Vitamin D deficiency, unspecified: Secondary | ICD-10-CM | POA: Diagnosis not present

## 2024-02-14 DIAGNOSIS — M25551 Pain in right hip: Secondary | ICD-10-CM | POA: Diagnosis not present

## 2024-02-14 DIAGNOSIS — K5909 Other constipation: Secondary | ICD-10-CM | POA: Diagnosis not present

## 2024-02-14 DIAGNOSIS — Z299 Encounter for prophylactic measures, unspecified: Secondary | ICD-10-CM | POA: Diagnosis not present

## 2024-02-14 DIAGNOSIS — K760 Fatty (change of) liver, not elsewhere classified: Secondary | ICD-10-CM | POA: Diagnosis not present

## 2024-02-14 DIAGNOSIS — G4700 Insomnia, unspecified: Secondary | ICD-10-CM | POA: Diagnosis not present

## 2024-02-14 DIAGNOSIS — G3184 Mild cognitive impairment, so stated: Secondary | ICD-10-CM | POA: Diagnosis not present

## 2024-02-14 DIAGNOSIS — E785 Hyperlipidemia, unspecified: Secondary | ICD-10-CM | POA: Diagnosis not present

## 2024-02-14 DIAGNOSIS — E039 Hypothyroidism, unspecified: Secondary | ICD-10-CM | POA: Diagnosis not present

## 2024-02-14 DIAGNOSIS — Z7401 Bed confinement status: Secondary | ICD-10-CM | POA: Diagnosis not present

## 2024-02-14 DIAGNOSIS — R7303 Prediabetes: Secondary | ICD-10-CM | POA: Diagnosis not present

## 2024-02-14 DIAGNOSIS — Z89621 Acquired absence of right hip joint: Secondary | ICD-10-CM | POA: Diagnosis not present

## 2024-02-14 DIAGNOSIS — Z23 Encounter for immunization: Secondary | ICD-10-CM | POA: Diagnosis not present

## 2024-02-14 DIAGNOSIS — S72001D Fracture of unspecified part of neck of right femur, subsequent encounter for closed fracture with routine healing: Secondary | ICD-10-CM | POA: Diagnosis not present

## 2024-02-14 DIAGNOSIS — R531 Weakness: Secondary | ICD-10-CM | POA: Diagnosis not present

## 2024-02-14 DIAGNOSIS — M6281 Muscle weakness (generalized): Secondary | ICD-10-CM | POA: Diagnosis not present

## 2024-02-14 DIAGNOSIS — R2681 Unsteadiness on feet: Secondary | ICD-10-CM | POA: Diagnosis not present

## 2024-02-14 NOTE — Plan of Care (Signed)
   Problem: Coping: Goal: Level of anxiety will decrease Outcome: Progressing   Problem: Pain Managment: Goal: General experience of comfort will improve and/or be controlled Outcome: Progressing   Problem: Safety: Goal: Ability to remain free from injury will improve Outcome: Progressing

## 2024-02-14 NOTE — TOC Transition Note (Signed)
 Transition of Care [facility]) - Discharge Note   Patient Details  Name: Tasha Scott MRN: [id number] Date of Birth: 1936/05/14  Transition of Care [facility]) CM/SW Contact:  Bari Leys, RN Phone Number: 02/14/2024, 10:18 AM   Clinical Narrative:   PASRR obtained # PASRR # [id number] . DC to [facility], [address], Call Report [phone]. [facility] for transport.      Final next level of care: Skilled Nursing Facility Barriers to Discharge: Continued Medical Work up   Patient Goals and CMS Choice Patient states their goals for this hospitalization and ongoing recovery are:: return home CMS Medicare.gov Compare Post Acute Care list provided to:: Patient Choice offered to / list presented to : Patient [place] ownership interest in [facility].provided to:: Patient    Discharge Placement              Patient chooses bed at: [facility] Patient to be transferred to facility by: [facility] Name of family member notified: Dewayne Ford (Daughter)  [phone] [facility] Phone) Patient and family notified of of transfer: 02/13/24  Discharge Plan and Services Additional resources added to the After Visit Summary for                                       Social Drivers of Health (SDOH) Interventions SDOH Screenings   Food Insecurity: No Food Insecurity (02/09/2024)  Housing: Low Risk  (02/09/2024)  Transportation Needs: No Transportation Needs (02/09/2024)  Utilities: Not At Risk (02/09/2024)  Social Connections: Socially Isolated (02/09/2024)  Tobacco Use: Unknown (02/09/2024)     Readmission Risk Interventions    02/10/2024    2:30 PM 02/09/2024    9:29 AM  Readmission Risk Prevention Plan  Post Dischage Appt Complete Complete  Medication Screening Complete Complete  Transportation Screening Complete Complete

## 2024-02-14 NOTE — Care Plan (Signed)
 This patient was discharged to SNF yesterday but she couldn't go due to transport not being arranged, She is going to be discharged today. Patient denies any concerns.

## 2024-02-15 DIAGNOSIS — M25551 Pain in right hip: Secondary | ICD-10-CM | POA: Diagnosis not present

## 2024-02-15 DIAGNOSIS — I129 Hypertensive chronic kidney disease with stage 1 through stage 4 chronic kidney disease, or unspecified chronic kidney disease: Secondary | ICD-10-CM | POA: Diagnosis not present

## 2024-02-15 DIAGNOSIS — S72001D Fracture of unspecified part of neck of right femur, subsequent encounter for closed fracture with routine healing: Secondary | ICD-10-CM | POA: Diagnosis not present

## 2024-02-15 DIAGNOSIS — K219 Gastro-esophageal reflux disease without esophagitis: Secondary | ICD-10-CM | POA: Diagnosis not present

## 2024-02-16 DIAGNOSIS — M25551 Pain in right hip: Secondary | ICD-10-CM | POA: Diagnosis not present

## 2024-02-16 DIAGNOSIS — I129 Hypertensive chronic kidney disease with stage 1 through stage 4 chronic kidney disease, or unspecified chronic kidney disease: Secondary | ICD-10-CM | POA: Diagnosis not present

## 2024-02-16 DIAGNOSIS — S72001D Fracture of unspecified part of neck of right femur, subsequent encounter for closed fracture with routine healing: Secondary | ICD-10-CM | POA: Diagnosis not present

## 2024-02-16 DIAGNOSIS — K219 Gastro-esophageal reflux disease without esophagitis: Secondary | ICD-10-CM | POA: Diagnosis not present

## 2024-02-20 DIAGNOSIS — I129 Hypertensive chronic kidney disease with stage 1 through stage 4 chronic kidney disease, or unspecified chronic kidney disease: Secondary | ICD-10-CM | POA: Diagnosis not present

## 2024-02-20 DIAGNOSIS — M25551 Pain in right hip: Secondary | ICD-10-CM | POA: Diagnosis not present

## 2024-02-22 DIAGNOSIS — R2681 Unsteadiness on feet: Secondary | ICD-10-CM | POA: Diagnosis not present

## 2024-02-22 DIAGNOSIS — M6281 Muscle weakness (generalized): Secondary | ICD-10-CM | POA: Diagnosis not present

## 2024-02-28 DIAGNOSIS — S72001A Fracture of unspecified part of neck of right femur, initial encounter for closed fracture: Secondary | ICD-10-CM | POA: Diagnosis not present

## 2024-02-28 DIAGNOSIS — G4700 Insomnia, unspecified: Secondary | ICD-10-CM | POA: Diagnosis not present

## 2024-02-28 DIAGNOSIS — R0683 Snoring: Secondary | ICD-10-CM | POA: Diagnosis not present

## 2024-02-28 DIAGNOSIS — I129 Hypertensive chronic kidney disease with stage 1 through stage 4 chronic kidney disease, or unspecified chronic kidney disease: Secondary | ICD-10-CM | POA: Diagnosis not present

## 2024-02-28 DIAGNOSIS — E785 Hyperlipidemia, unspecified: Secondary | ICD-10-CM | POA: Diagnosis not present

## 2024-02-28 DIAGNOSIS — F32A Depression, unspecified: Secondary | ICD-10-CM | POA: Diagnosis not present

## 2024-02-28 DIAGNOSIS — S72001D Fracture of unspecified part of neck of right femur, subsequent encounter for closed fracture with routine healing: Secondary | ICD-10-CM | POA: Diagnosis not present

## 2024-02-28 DIAGNOSIS — N1831 Chronic kidney disease, stage 3a: Secondary | ICD-10-CM | POA: Diagnosis not present

## 2024-02-28 DIAGNOSIS — M1990 Unspecified osteoarthritis, unspecified site: Secondary | ICD-10-CM | POA: Diagnosis not present

## 2024-02-28 DIAGNOSIS — E039 Hypothyroidism, unspecified: Secondary | ICD-10-CM | POA: Diagnosis not present

## 2024-02-28 DIAGNOSIS — G3184 Mild cognitive impairment, so stated: Secondary | ICD-10-CM | POA: Diagnosis not present

## 2024-02-28 DIAGNOSIS — R7303 Prediabetes: Secondary | ICD-10-CM | POA: Diagnosis not present

## 2024-02-28 DIAGNOSIS — R419 Unspecified symptoms and signs involving cognitive functions and awareness: Secondary | ICD-10-CM | POA: Diagnosis not present

## 2024-03-12 DIAGNOSIS — G3184 Mild cognitive impairment, so stated: Secondary | ICD-10-CM | POA: Diagnosis not present

## 2024-03-12 DIAGNOSIS — Z23 Encounter for immunization: Secondary | ICD-10-CM | POA: Diagnosis not present

## 2024-03-12 DIAGNOSIS — S72001D Fracture of unspecified part of neck of right femur, subsequent encounter for closed fracture with routine healing: Secondary | ICD-10-CM | POA: Diagnosis not present

## 2024-03-12 DIAGNOSIS — Z96641 Presence of right artificial hip joint: Secondary | ICD-10-CM | POA: Diagnosis not present

## 2024-03-12 DIAGNOSIS — Z471 Aftercare following joint replacement surgery: Secondary | ICD-10-CM | POA: Diagnosis not present

## 2024-03-12 DIAGNOSIS — R0683 Snoring: Secondary | ICD-10-CM | POA: Diagnosis not present

## 2024-04-02 DIAGNOSIS — G4733 Obstructive sleep apnea (adult) (pediatric): Secondary | ICD-10-CM | POA: Diagnosis not present

## 2024-04-09 DIAGNOSIS — G4733 Obstructive sleep apnea (adult) (pediatric): Secondary | ICD-10-CM | POA: Diagnosis not present

## 2024-04-12 IMAGING — CT CT ABD-PELV W/ CM
2 of 5 series · 16 of 46 positions shown, 18 images · IV contrast (agent unspecified)
Comparison: Ultrasound 03/06/2022

CLINICAL DATA: Abdomen pain

EXAM:
CT ABDOMEN AND PELVIS WITH CONTRAST
TECHNIQUE: Multidetector CT imaging of the abdomen and pelvis was performed
using the standard protocol following bolus administration of
intravenous contrast.

[Series 2: axial st · axial · 0.83mm/px · z∈[+1240,+1636]mm · 13 of 93 slices shown, 15 images]
[im 7/93  soft-tissue]
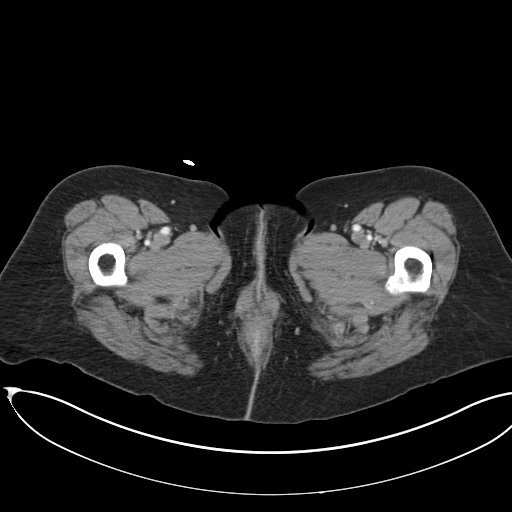
[im 7/93  bone]
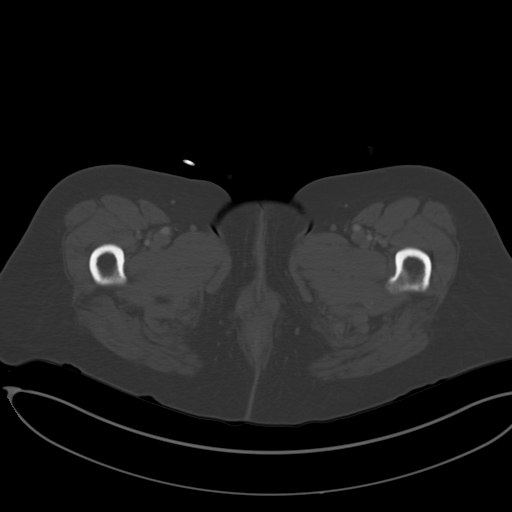
[im 13/93  soft-tissue]
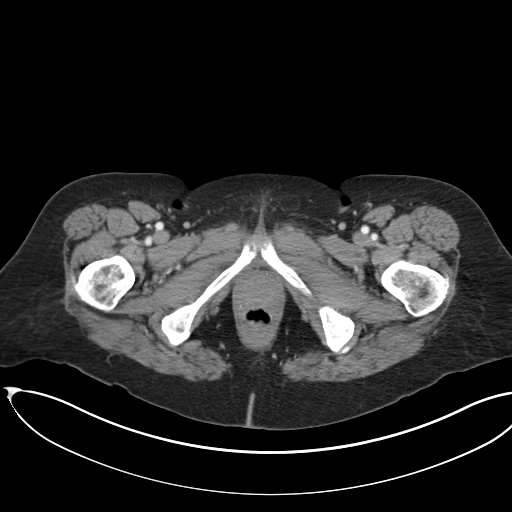
[im 19/93  soft-tissue]
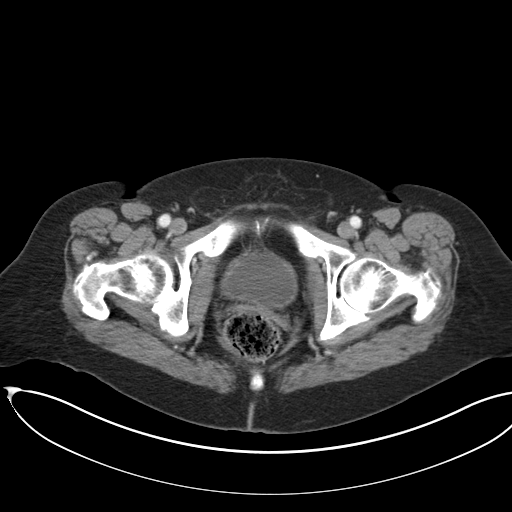
[im 25/93  soft-tissue]
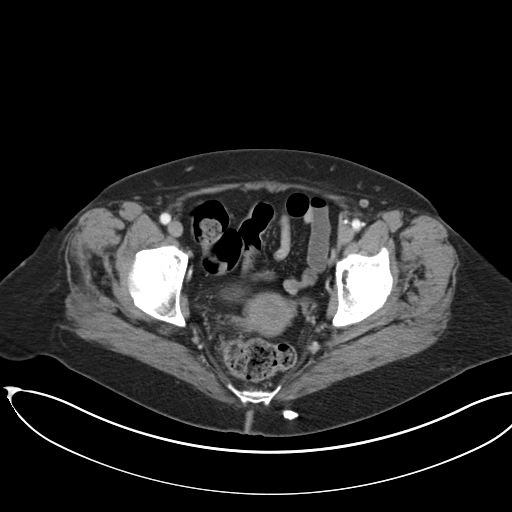
[im 31/93  soft-tissue]
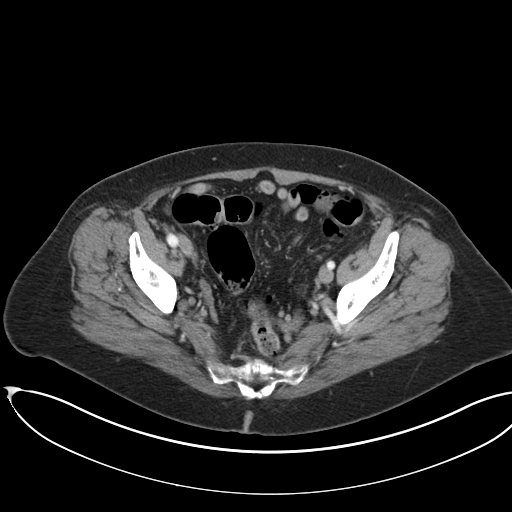
[im 37/93  soft-tissue]
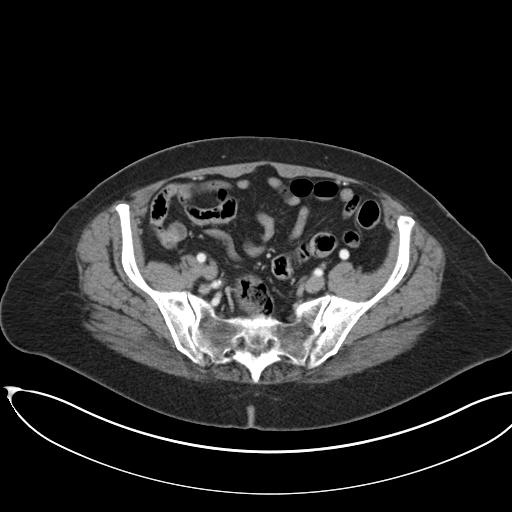
[im 50/93  soft-tissue]
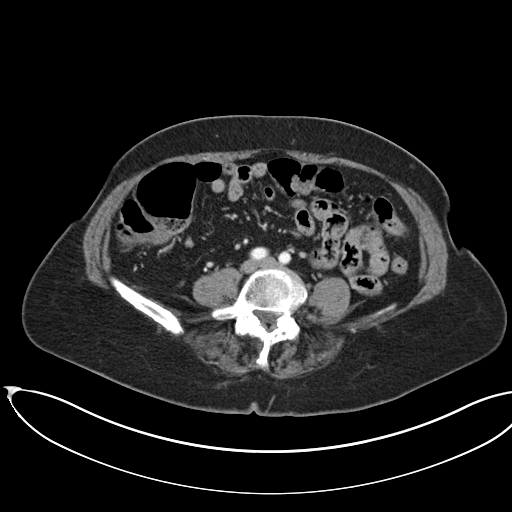
[im 56/93  soft-tissue]
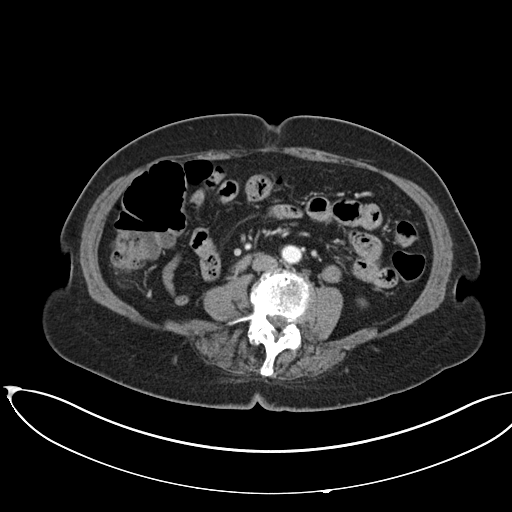
[im 62/93  soft-tissue]
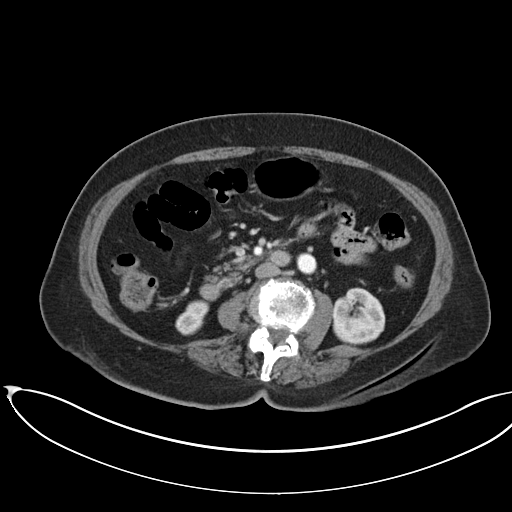
[im 62/93  bone]
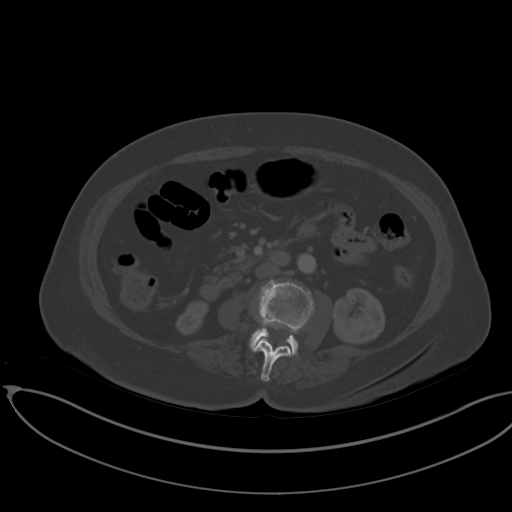
[im 68/93  soft-tissue]
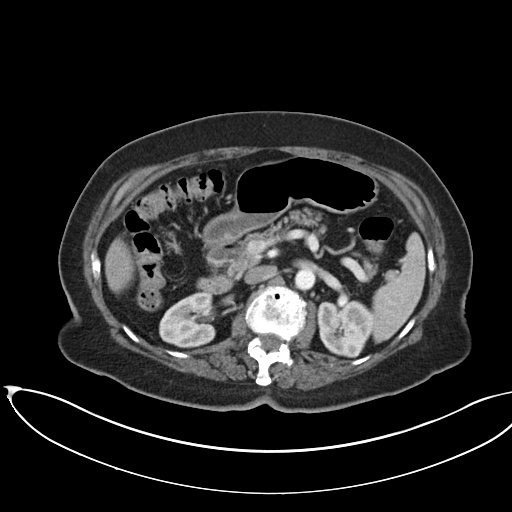
[im 74/93  soft-tissue]
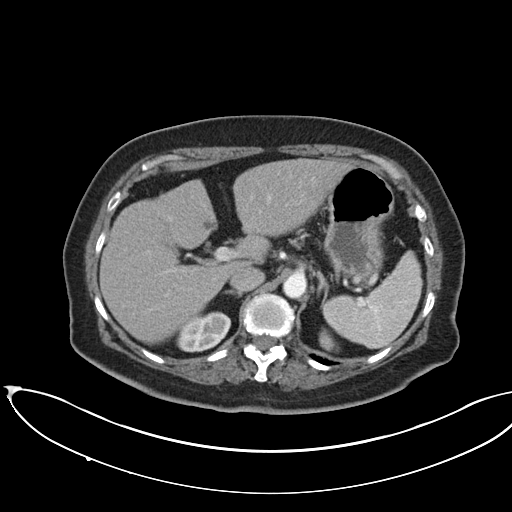
[im 80/93  soft-tissue]
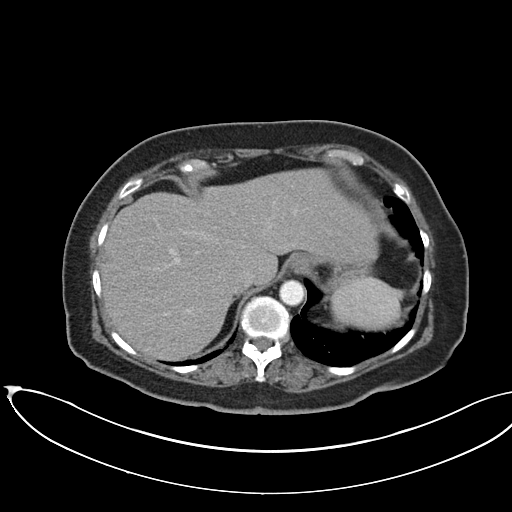
[im 86/93  soft-tissue]
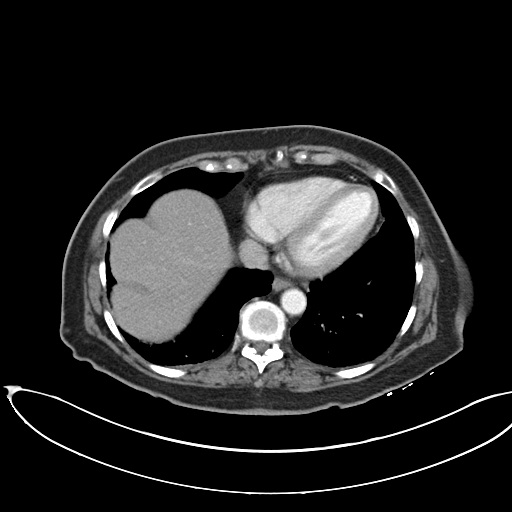

[Series 5: coronal st · coronal · 0.81mm/px · 3 of 136 slices shown]
[im 46/136  soft-tissue]
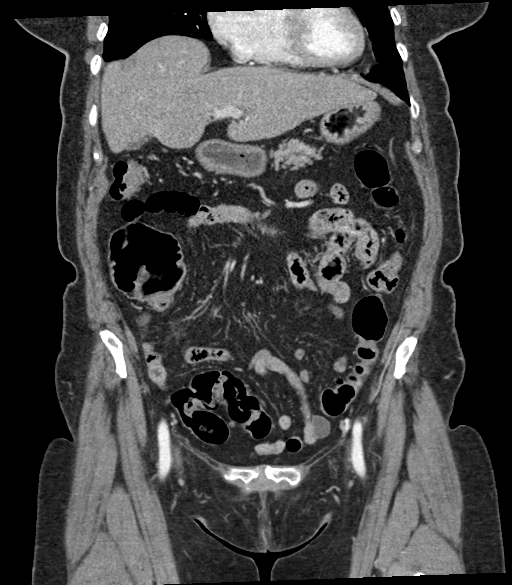
[im 61/136  soft-tissue]
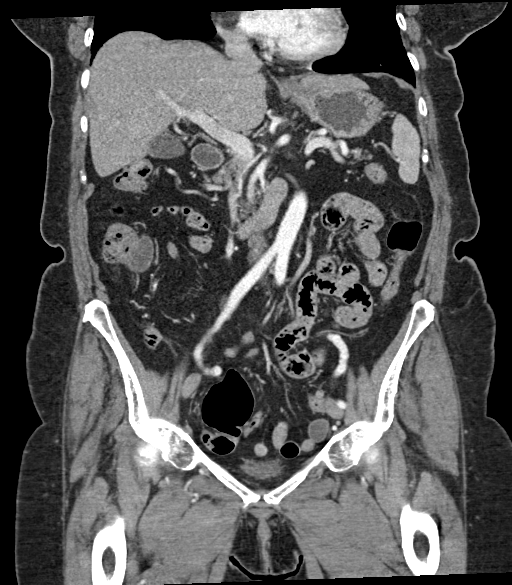
[im 76/136  soft-tissue]
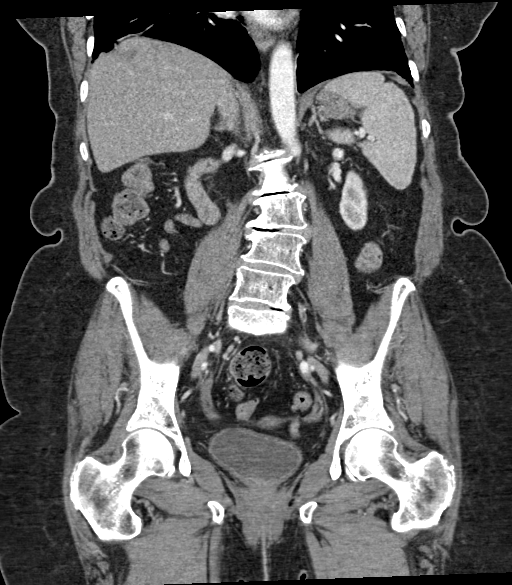

[16 of 46 positions shown; findings below may reference images not displayed]

RADIATION DOSE REDUCTION: This exam was performed according to the
departmental dose-optimization program which includes automated
exposure control, adjustment of the mA and/or kV according to
patient size and/or use of iterative reconstruction technique.

CONTRAST:  100mL OMNIPAQUE IOHEXOL 300 MG/ML  SOLN
FINDINGS: Lower chest: Lung bases demonstrate no acute airspace disease or
pleural effusion.

Hepatobiliary: No calcified gallstone or biliary dilatation.
Subcentimeter hypodensity within the inferior left hepatic lobe too
small to further characterize.

Pancreas: Unremarkable. No pancreatic ductal dilatation or
surrounding inflammatory changes.

Spleen: Normal in size without focal abnormality.

Adrenals/Urinary Tract: Adrenal glands are unremarkable. Kidneys are
normal, without renal calculi, focal lesion, or hydronephrosis.
Bladder is slightly thick walled and indistinct with mild mucosal
enhancement.

Stomach/Bowel: Stomach nonenlarged. No dilated small bowel. No acute
bowel wall thickening. Mild diverticular disease of the left colon.
Appendix not well seen but no right lower quadrant inflammatory
process.

Vascular/Lymphatic: Nonaneurysmal aorta.  No suspicious lymph nodes.

Reproductive: Uterus and bilateral adnexa are unremarkable.

Other: Negative for pelvic effusion or free air

Musculoskeletal: Scoliosis and degenerative changes of the spine. No
acute osseous abnormality
IMPRESSION: 1. Slightly thick-walled appearance of urinary bladder which is
indistinct with mild mucosal enhancement, question cystitis.
2. No CT evidence for acute intra-abdominal or pelvic abnormality.
3. Diverticular disease of the left colon without acute inflammation

## 2024-04-23 DIAGNOSIS — N390 Urinary tract infection, site not specified: Secondary | ICD-10-CM | POA: Diagnosis not present

## 2024-04-23 DIAGNOSIS — F32A Depression, unspecified: Secondary | ICD-10-CM | POA: Diagnosis not present

## 2024-04-23 DIAGNOSIS — E782 Mixed hyperlipidemia: Secondary | ICD-10-CM | POA: Diagnosis not present

## 2024-04-23 DIAGNOSIS — S72001D Fracture of unspecified part of neck of right femur, subsequent encounter for closed fracture with routine healing: Secondary | ICD-10-CM | POA: Diagnosis not present

## 2024-04-23 DIAGNOSIS — E039 Hypothyroidism, unspecified: Secondary | ICD-10-CM | POA: Diagnosis not present

## 2024-05-10 ENCOUNTER — Other Ambulatory Visit (HOSPITAL_BASED_OUTPATIENT_CLINIC_OR_DEPARTMENT_OTHER): Payer: Self-pay | Admitting: Family Medicine

## 2024-05-10 DIAGNOSIS — S72001A Fracture of unspecified part of neck of right femur, initial encounter for closed fracture: Secondary | ICD-10-CM

## 2024-05-28 ENCOUNTER — Ambulatory Visit: Payer: HMO | Admitting: Family Medicine

## 2024-07-13 DIAGNOSIS — R296 Repeated falls: Secondary | ICD-10-CM | POA: Diagnosis not present

## 2024-07-13 DIAGNOSIS — Z23 Encounter for immunization: Secondary | ICD-10-CM | POA: Diagnosis not present

## 2024-07-13 DIAGNOSIS — R2689 Other abnormalities of gait and mobility: Secondary | ICD-10-CM | POA: Diagnosis not present

## 2024-07-13 DIAGNOSIS — Z9889 Other specified postprocedural states: Secondary | ICD-10-CM | POA: Diagnosis not present

## 2024-07-24 ENCOUNTER — Emergency Department (HOSPITAL_COMMUNITY)

## 2024-07-24 ENCOUNTER — Emergency Department (HOSPITAL_COMMUNITY)
Admission: EM | Admit: 2024-07-24 | Discharge: 2024-07-24 | Disposition: A | Discharge status: Home / Self Care | Source: Ambulatory Visit | Attending: Emergency Medicine | Admitting: Emergency Medicine

## 2024-07-24 ENCOUNTER — Encounter (HOSPITAL_COMMUNITY): Payer: Self-pay

## 2024-07-24 DIAGNOSIS — M25551 Pain in right hip: Secondary | ICD-10-CM | POA: Diagnosis not present

## 2024-07-24 DIAGNOSIS — S0003XA Contusion of scalp, initial encounter: Secondary | ICD-10-CM | POA: Diagnosis not present

## 2024-07-24 DIAGNOSIS — W010XXA Fall on same level from slipping, tripping and stumbling without subsequent striking against object, initial encounter: Secondary | ICD-10-CM | POA: Insufficient documentation

## 2024-07-24 DIAGNOSIS — S0990XA Unspecified injury of head, initial encounter: Secondary | ICD-10-CM | POA: Diagnosis not present

## 2024-07-24 DIAGNOSIS — Z96641 Presence of right artificial hip joint: Secondary | ICD-10-CM | POA: Diagnosis not present

## 2024-07-24 DIAGNOSIS — W19XXXA Unspecified fall, initial encounter: Secondary | ICD-10-CM

## 2024-07-24 NOTE — Discharge Instructions (Signed)
 Fortunately, there is no trauma to your brain or skull from your fall today.  You can use Tylenol  if you develop pain or use ice on the bruise.  If you develop new or worsening headache or any other new/concerning symptoms then return to the ER.

## 2024-07-24 NOTE — ED Provider Notes (Signed)
 [place] [facility] AT [facility] Provider Note   CSN: [id number] Arrival date & time: 07/24/24  1631     Patient presents with: Tasha Scott is a 88 y.o. female.   HPI 88 year old female presents with a trip and fall and head injury.  She was getting out of her car and thinks she tripped over a concrete slab.  Did not lose consciousness.  She has a bruise to her left scalp.  She had her hip replaced several months ago and wants to get that checked as well but there is no pain or difficulty walking.  No numbness, weakness, vomiting.  Prior to Admission medications   Medication Sig Start Date End Date Taking? Authorizing Provider  amLODipine  (NORVASC ) 5 MG tablet Take 1 tablet (5 mg total) by mouth daily. 02/14/24 03/15/24  Leotis Bogus, MD  atorvastatin  (LIPITOR) 20 MG tablet Take 1 tablet (20 mg total) by mouth daily. 12/13/23   Ozell Heron HERO, MD  b complex vitamins capsule Take 1 capsule by mouth daily.    [provider]  Cholecalciferol (VITAMIN D3) 125 MCG (5000 UT) CAPS Take 1 capsule by mouth daily.    [provider]  memantine  (NAMENDA ) 5 MG tablet Take 1 tablet (5 mg total) by mouth daily for 30 days, THEN 1 tablet (5 mg total) 2 (two) times daily. 11/28/23 01/27/24  Ozell Heron HERO, MD  mirtazapine (REMERON) 15 MG tablet Take 15 mg by mouth at bedtime. 02/06/24   [provider]  Multiple Vitamins-Minerals (WOMENS MULTIVITAMIN) TABS Take 1 tablet by mouth daily.    [provider]  Nutritional Supplements (JUICE PLUS FIBRE PO) Take 2 capsules by mouth in the morning and at bedtime.    [provider]  Probiotic Product (PROBIOTIC-10 PO) Take 1 capsule by mouth daily.    [provider]  sertraline  (ZOLOFT ) 100 MG tablet Take 1.5 tablets (150 mg total) by mouth daily. 12/13/23   Ozell Heron HERO, MD  SYNTHROID  50 MCG tablet Take 1 tablet (50 mcg total) by mouth every Tuesday, Thursday,  Saturday, and Sunday. 11/29/23   Ozell Heron HERO, MD  SYNTHROID  75 MCG tablet Take 1 tablet (75 mcg total) by mouth every Monday, Wednesday, and Friday. 11/28/23   Ozell Heron HERO, MD    Allergies: Codeine    Review of Systems  Eyes:  Negative for visual disturbance.  Gastrointestinal:  Negative for vomiting.  Musculoskeletal:  Negative for arthralgias and neck pain.  Neurological:  Positive for headaches. Negative for weakness and numbness.    Updated Vital Signs BP (!) 144/90   Pulse 86   Temp 98 F (36.7 C) (Oral)   Resp 19   Ht 5' 5 (1.651 m)   Wt 82 kg   SpO2 100%   BMI 30.08 kg/m   Physical Exam Vitals and nursing note reviewed.  Constitutional:      General: She is not in acute distress.    Appearance: She is well-developed. She is not ill-appearing or diaphoretic.  HENT:     Head: Normocephalic. Contusion present.   Eyes:     Extraocular Movements: Extraocular movements intact.  Cardiovascular:     Rate and Rhythm: Normal rate and regular rhythm.     Heart sounds: Normal heart sounds.  Pulmonary:     Effort: Pulmonary effort is normal.  Abdominal:     General: There is no distension.  Musculoskeletal:     Right hip: No tenderness.  Normal range of motion.     Left hip: No tenderness. Normal range of motion.  Skin:    General: Skin is warm and dry.  Neurological:     Mental Status: She is alert.     Comments: CN 3-12 grossly intact. 5/5 strength in all 4 extremities. Grossly normal sensation. Normal finger to nose. Normal gait.     (all labs ordered are listed, but only abnormal results are displayed) Labs Reviewed - No data to display  EKG: None  Radiology: CT Head Wo Contrast Result Date: 07/24/2024 CLINICAL DATA:  Tripped and fell, hit head EXAM: CT HEAD WITHOUT CONTRAST TECHNIQUE: Contiguous axial images were obtained from the base of the skull through the vertex without intravenous contrast. RADIATION DOSE REDUCTION: This exam was  performed according to the departmental dose-optimization program which includes automated exposure control, adjustment of the mA and/or kV according to patient size and/or use of iterative reconstruction technique. COMPARISON:  02/09/2024 FINDINGS: Brain: No acute infarct or hemorrhage. The lateral ventricles and midline structures are unremarkable. No acute extra-axial fluid collections. No mass effect. Vascular: No hyperdense vessel or unexpected calcification. Skull: Normal. Negative for fracture or focal lesion. Sinuses/Orbits: No acute finding. Other: None. IMPRESSION: 1. No acute intracranial process. Electronically Signed   By: Ozell Daring M.D.   On: 07/24/2024 17:34   DG Hip Unilat W or Wo Pelvis 2-3 Views Right Result Date: 07/24/2024 EXAM: 2 OR MORE VIEW(S) XRAY OF THE RIGHT HIP 07/24/2024 05:11:00 PM COMPARISON: 02/09/2024 CLINICAL HISTORY: hip pain. Patient tripped over concrete and fell while trying to get into her car. Hit her head. No blood thinners. No LOC. Is worried about her right hip also. She had a hip replacement in may. Pt is ambulatory , denies any specific pain in her right hip FINDINGS: BONES AND JOINTS: Right hip arthroplasty in place. No acute fracture or focal osseous lesion of the right hip. SOFT TISSUES: Calcification in the left hemipelvis likely related to left ovarian vein. The soft tissues of the right hip are unremarkable. IMPRESSION: 1. No acute fracture or dislocation. Electronically signed by: Norman Gatlin MD 07/24/2024 05:28 PM EDT RP Workstation: [id number]     Procedures   Medications Ordered in the ED - No data to display                                  Medical Decision Making Amount and/or Complexity of Data Reviewed Radiology: ordered and independent interpretation performed.    Details: No hip fracture or dislocation.  No head bleed.   Patient presents with a trip and a fall.  No significant injury noted on head CT.  She asked for a hip x-ray  which was obtained through triage, but it does not seem like any significant injury actually occurred there.  She is ambulatory here, highly doubt an occult fracture.  She does not appear to be on blood thinners.  She declines anything for pain.  Sounds like this was a mechanical fall, will discharge home with return precautions.     Final diagnoses:  Fall, initial encounter  Contusion of scalp, initial encounter    ED Discharge Orders     None          Freddi Hamilton, MD 07/24/24 [phone]

## 2024-07-24 NOTE — ED Triage Notes (Signed)
 Patient tripped over concrete and fell while trying to get into her car. Hit her head. No blood thinners. No LOC. Is worried about her right hip also. She had a hip replacement in may.

## 2024-08-09 DIAGNOSIS — R2689 Other abnormalities of gait and mobility: Secondary | ICD-10-CM | POA: Diagnosis not present

## 2024-08-09 DIAGNOSIS — M6281 Muscle weakness (generalized): Secondary | ICD-10-CM | POA: Diagnosis not present

## 2024-08-09 DIAGNOSIS — R2681 Unsteadiness on feet: Secondary | ICD-10-CM | POA: Diagnosis not present

## 2024-08-09 DIAGNOSIS — R296 Repeated falls: Secondary | ICD-10-CM | POA: Diagnosis not present

## 2024-08-14 DIAGNOSIS — M6281 Muscle weakness (generalized): Secondary | ICD-10-CM | POA: Diagnosis not present

## 2024-08-14 DIAGNOSIS — R2681 Unsteadiness on feet: Secondary | ICD-10-CM | POA: Diagnosis not present

## 2024-08-14 DIAGNOSIS — R2689 Other abnormalities of gait and mobility: Secondary | ICD-10-CM | POA: Diagnosis not present

## 2024-08-14 DIAGNOSIS — R296 Repeated falls: Secondary | ICD-10-CM | POA: Diagnosis not present

## 2024-08-16 DIAGNOSIS — M6281 Muscle weakness (generalized): Secondary | ICD-10-CM | POA: Diagnosis not present

## 2024-08-16 DIAGNOSIS — H5203 Hypermetropia, bilateral: Secondary | ICD-10-CM | POA: Diagnosis not present

## 2024-08-16 DIAGNOSIS — R2689 Other abnormalities of gait and mobility: Secondary | ICD-10-CM | POA: Diagnosis not present

## 2024-08-16 DIAGNOSIS — H52223 Regular astigmatism, bilateral: Secondary | ICD-10-CM | POA: Diagnosis not present

## 2024-08-16 DIAGNOSIS — R296 Repeated falls: Secondary | ICD-10-CM | POA: Diagnosis not present

## 2024-08-16 DIAGNOSIS — R2681 Unsteadiness on feet: Secondary | ICD-10-CM | POA: Diagnosis not present

## 2024-08-16 DIAGNOSIS — H353131 Nonexudative age-related macular degeneration, bilateral, early dry stage: Secondary | ICD-10-CM | POA: Diagnosis not present

## 2024-08-16 DIAGNOSIS — H524 Presbyopia: Secondary | ICD-10-CM | POA: Diagnosis not present

## 2024-08-16 DIAGNOSIS — H26492 Other secondary cataract, left eye: Secondary | ICD-10-CM | POA: Diagnosis not present

## 2024-08-16 DIAGNOSIS — H25811 Combined forms of age-related cataract, right eye: Secondary | ICD-10-CM | POA: Diagnosis not present

## 2024-08-21 DIAGNOSIS — R2689 Other abnormalities of gait and mobility: Secondary | ICD-10-CM | POA: Diagnosis not present

## 2024-08-21 DIAGNOSIS — R2681 Unsteadiness on feet: Secondary | ICD-10-CM | POA: Diagnosis not present

## 2024-08-21 DIAGNOSIS — M6281 Muscle weakness (generalized): Secondary | ICD-10-CM | POA: Diagnosis not present

## 2024-08-21 DIAGNOSIS — R296 Repeated falls: Secondary | ICD-10-CM | POA: Diagnosis not present

## 2024-08-23 DIAGNOSIS — R2681 Unsteadiness on feet: Secondary | ICD-10-CM | POA: Diagnosis not present

## 2024-08-23 DIAGNOSIS — R2689 Other abnormalities of gait and mobility: Secondary | ICD-10-CM | POA: Diagnosis not present

## 2024-08-23 DIAGNOSIS — M6281 Muscle weakness (generalized): Secondary | ICD-10-CM | POA: Diagnosis not present

## 2024-08-23 DIAGNOSIS — R296 Repeated falls: Secondary | ICD-10-CM | POA: Diagnosis not present

## 2024-08-27 DIAGNOSIS — R2681 Unsteadiness on feet: Secondary | ICD-10-CM | POA: Diagnosis not present

## 2024-08-27 DIAGNOSIS — R2689 Other abnormalities of gait and mobility: Secondary | ICD-10-CM | POA: Diagnosis not present

## 2024-08-27 DIAGNOSIS — M6281 Muscle weakness (generalized): Secondary | ICD-10-CM | POA: Diagnosis not present

## 2024-08-27 DIAGNOSIS — R296 Repeated falls: Secondary | ICD-10-CM | POA: Diagnosis not present

## 2024-09-06 DIAGNOSIS — M6281 Muscle weakness (generalized): Secondary | ICD-10-CM | POA: Diagnosis not present

## 2024-09-06 DIAGNOSIS — R2689 Other abnormalities of gait and mobility: Secondary | ICD-10-CM | POA: Diagnosis not present

## 2024-09-06 DIAGNOSIS — R296 Repeated falls: Secondary | ICD-10-CM | POA: Diagnosis not present

## 2024-09-06 DIAGNOSIS — R2681 Unsteadiness on feet: Secondary | ICD-10-CM | POA: Diagnosis not present

## 2024-09-12 DIAGNOSIS — E782 Mixed hyperlipidemia: Secondary | ICD-10-CM | POA: Diagnosis not present

## 2024-09-12 DIAGNOSIS — N1831 Chronic kidney disease, stage 3a: Secondary | ICD-10-CM | POA: Diagnosis not present

## 2024-11-15 ENCOUNTER — Encounter: Admitting: Nurse Practitioner
